# Patient Record
Sex: Male | Born: 1970 | Race: Black or African American | Hispanic: No | Marital: Married | Smoking: Never smoker
Health system: Southern US, Community
[De-identification: ages and names within clinical notes are randomized; demographics above are authoritative.]

## PROBLEM LIST (undated history)

## (undated) DIAGNOSIS — J40 Bronchitis, not specified as acute or chronic: Secondary | ICD-10-CM

## (undated) DIAGNOSIS — I10 Essential (primary) hypertension: Secondary | ICD-10-CM

## (undated) DIAGNOSIS — K219 Gastro-esophageal reflux disease without esophagitis: Secondary | ICD-10-CM

## (undated) HISTORY — PX: HERNIA REPAIR: SHX51

## (undated) HISTORY — PX: BACK SURGERY: SHX140

---

## 1997-05-28 ENCOUNTER — Ambulatory Visit (HOSPITAL_COMMUNITY): Admission: RE | Admit: 1997-05-28 | Discharge: 1997-05-28 | Payer: Self-pay | Admitting: Gastroenterology

## 1997-06-05 ENCOUNTER — Emergency Department (HOSPITAL_COMMUNITY): Admission: EM | Admit: 1997-06-05 | Discharge: 1997-06-05 | Payer: Self-pay | Admitting: Emergency Medicine

## 1998-03-01 ENCOUNTER — Emergency Department (HOSPITAL_COMMUNITY): Admission: EM | Admit: 1998-03-01 | Discharge: 1998-03-01 | Payer: Self-pay | Admitting: Emergency Medicine

## 1998-04-16 ENCOUNTER — Emergency Department (HOSPITAL_COMMUNITY): Admission: EM | Admit: 1998-04-16 | Discharge: 1998-04-16 | Payer: Self-pay | Admitting: Emergency Medicine

## 1998-08-15 ENCOUNTER — Emergency Department (HOSPITAL_COMMUNITY): Admission: EM | Admit: 1998-08-15 | Discharge: 1998-08-15 | Payer: Self-pay | Admitting: Emergency Medicine

## 1999-07-01 ENCOUNTER — Emergency Department (HOSPITAL_COMMUNITY): Admission: EM | Admit: 1999-07-01 | Discharge: 1999-07-01 | Payer: Self-pay | Admitting: Emergency Medicine

## 2000-04-13 ENCOUNTER — Emergency Department (HOSPITAL_COMMUNITY): Admission: EM | Admit: 2000-04-13 | Discharge: 2000-04-13 | Payer: Self-pay | Admitting: Emergency Medicine

## 2000-09-21 ENCOUNTER — Emergency Department (HOSPITAL_COMMUNITY): Admission: EM | Admit: 2000-09-21 | Discharge: 2000-09-21 | Payer: Self-pay | Admitting: Emergency Medicine

## 2000-09-21 ENCOUNTER — Encounter: Payer: Self-pay | Admitting: Emergency Medicine

## 2001-05-30 ENCOUNTER — Encounter: Payer: Self-pay | Admitting: Emergency Medicine

## 2001-05-30 ENCOUNTER — Emergency Department (HOSPITAL_COMMUNITY): Admission: EM | Admit: 2001-05-30 | Discharge: 2001-05-30 | Payer: Self-pay | Admitting: Emergency Medicine

## 2002-03-12 ENCOUNTER — Emergency Department (HOSPITAL_COMMUNITY): Admission: EM | Admit: 2002-03-12 | Discharge: 2002-03-12 | Payer: Self-pay | Admitting: Emergency Medicine

## 2002-03-12 ENCOUNTER — Encounter: Payer: Self-pay | Admitting: Emergency Medicine

## 2002-04-10 ENCOUNTER — Encounter: Admission: RE | Admit: 2002-04-10 | Discharge: 2002-04-10 | Payer: Self-pay | Admitting: Internal Medicine

## 2002-04-10 ENCOUNTER — Encounter: Payer: Self-pay | Admitting: Internal Medicine

## 2002-04-17 ENCOUNTER — Emergency Department (HOSPITAL_COMMUNITY): Admission: EM | Admit: 2002-04-17 | Discharge: 2002-04-18 | Payer: Self-pay | Admitting: Emergency Medicine

## 2002-06-05 ENCOUNTER — Ambulatory Visit (HOSPITAL_COMMUNITY): Admission: RE | Admit: 2002-06-05 | Discharge: 2002-06-07 | Payer: Self-pay | Admitting: Neurosurgery

## 2002-12-15 ENCOUNTER — Emergency Department (HOSPITAL_COMMUNITY): Admission: EM | Admit: 2002-12-15 | Discharge: 2002-12-15 | Payer: Self-pay | Admitting: Emergency Medicine

## 2004-01-21 ENCOUNTER — Emergency Department (HOSPITAL_COMMUNITY): Admission: EM | Admit: 2004-01-21 | Discharge: 2004-01-21 | Payer: Self-pay

## 2004-03-12 ENCOUNTER — Emergency Department (HOSPITAL_COMMUNITY): Admission: EM | Admit: 2004-03-12 | Discharge: 2004-03-12 | Payer: Self-pay | Admitting: Emergency Medicine

## 2004-06-04 ENCOUNTER — Emergency Department (HOSPITAL_COMMUNITY): Admission: EM | Admit: 2004-06-04 | Discharge: 2004-06-04 | Payer: Self-pay | Admitting: Emergency Medicine

## 2004-06-07 ENCOUNTER — Emergency Department (HOSPITAL_COMMUNITY): Admission: EM | Admit: 2004-06-07 | Discharge: 2004-06-07 | Payer: Self-pay | Admitting: Emergency Medicine

## 2004-06-14 ENCOUNTER — Encounter: Admission: RE | Admit: 2004-06-14 | Discharge: 2004-06-14 | Payer: Self-pay | Admitting: Neurosurgery

## 2004-07-28 ENCOUNTER — Inpatient Hospital Stay (HOSPITAL_COMMUNITY): Admission: RE | Admit: 2004-07-28 | Discharge: 2004-07-31 | Payer: Self-pay | Admitting: Neurosurgery

## 2004-10-06 ENCOUNTER — Encounter: Admission: RE | Admit: 2004-10-06 | Discharge: 2004-11-22 | Payer: Self-pay | Admitting: Neurosurgery

## 2005-01-06 ENCOUNTER — Emergency Department (HOSPITAL_COMMUNITY): Admission: EM | Admit: 2005-01-06 | Discharge: 2005-01-06 | Payer: Self-pay | Admitting: Emergency Medicine

## 2005-03-09 ENCOUNTER — Emergency Department (HOSPITAL_COMMUNITY): Admission: EM | Admit: 2005-03-09 | Discharge: 2005-03-09 | Payer: Self-pay | Admitting: *Deleted

## 2005-04-12 ENCOUNTER — Encounter: Admission: RE | Admit: 2005-04-12 | Discharge: 2005-04-12 | Payer: Self-pay | Admitting: Neurosurgery

## 2005-05-13 ENCOUNTER — Encounter: Admission: RE | Admit: 2005-05-13 | Discharge: 2005-06-02 | Payer: Self-pay | Admitting: Neurosurgery

## 2005-07-29 ENCOUNTER — Ambulatory Visit (HOSPITAL_COMMUNITY): Admission: RE | Admit: 2005-07-29 | Discharge: 2005-07-29 | Payer: Self-pay | Admitting: Neurosurgery

## 2005-10-12 ENCOUNTER — Emergency Department (HOSPITAL_COMMUNITY): Admission: EM | Admit: 2005-10-12 | Discharge: 2005-10-12 | Payer: Self-pay | Admitting: *Deleted

## 2005-11-01 ENCOUNTER — Emergency Department (HOSPITAL_COMMUNITY): Admission: EM | Admit: 2005-11-01 | Discharge: 2005-11-01 | Payer: Self-pay | Admitting: Emergency Medicine

## 2006-11-30 ENCOUNTER — Emergency Department (HOSPITAL_COMMUNITY): Admission: EM | Admit: 2006-11-30 | Discharge: 2006-11-30 | Payer: Self-pay | Admitting: Emergency Medicine

## 2007-06-15 ENCOUNTER — Emergency Department (HOSPITAL_COMMUNITY): Admission: EM | Admit: 2007-06-15 | Discharge: 2007-06-16 | Payer: Self-pay | Admitting: Emergency Medicine

## 2008-03-13 ENCOUNTER — Emergency Department (HOSPITAL_COMMUNITY): Admission: EM | Admit: 2008-03-13 | Discharge: 2008-03-13 | Payer: Self-pay | Admitting: Emergency Medicine

## 2008-07-05 ENCOUNTER — Emergency Department (HOSPITAL_COMMUNITY): Admission: EM | Admit: 2008-07-05 | Discharge: 2008-07-06 | Payer: Self-pay | Admitting: *Deleted

## 2010-05-03 LAB — URINALYSIS, ROUTINE W REFLEX MICROSCOPIC
Bilirubin Urine: NEGATIVE
Glucose, UA: NEGATIVE mg/dL
Hgb urine dipstick: NEGATIVE
Protein, ur: NEGATIVE mg/dL
Specific Gravity, Urine: 1.028 (ref 1.005–1.030)

## 2010-05-03 LAB — GC/CHLAMYDIA PROBE AMP, GENITAL
Chlamydia, DNA Probe: NEGATIVE
GC Probe Amp, Genital: NEGATIVE

## 2010-05-11 LAB — DIFFERENTIAL
Lymphs Abs: 1.5 10*3/uL (ref 0.7–4.0)
Monocytes Absolute: 0.4 10*3/uL (ref 0.1–1.0)
Monocytes Relative: 10 % (ref 3–12)
Neutro Abs: 2.5 10*3/uL (ref 1.7–7.7)
Neutrophils Relative %: 55 % (ref 43–77)

## 2010-05-11 LAB — POCT CARDIAC MARKERS
CKMB, poc: 1.2 ng/mL (ref 1.0–8.0)
Myoglobin, poc: 49.3 ng/mL (ref 12–200)
Troponin i, poc: <0.05 ng/mL (ref 0.00–0.09)

## 2010-05-11 LAB — CBC
Hemoglobin: 13.8 g/dL (ref 13.0–17.0)
RBC: 4.29 MIL/uL (ref 4.22–5.81)
WBC: 4.6 10*3/uL (ref 4.0–10.5)

## 2010-05-11 LAB — POCT I-STAT, CHEM 8
HCT: 42 % (ref 39.0–52.0)
Hemoglobin: 14.3 g/dL (ref 13.0–17.0)
Potassium: 3.7 mEq/L (ref 3.5–5.1)
Sodium: 141 mEq/L (ref 135–145)

## 2010-06-11 NOTE — Discharge Summary (Signed)
[name], Andre Walters                 ACCOUNT NO.:  [id number]   MEDICAL RECORD NO.:  [id number]          PATIENT TYPE:  INP   LOCATION:  3011                         FACILITY:  [facility]   PHYSICIAN:  Cristi Loron, M.D.DATE OF BIRTH:  [birth year]   DATE OF ADMISSION:  07/28/2004  DATE OF DISCHARGE:  07/31/2004                                 DISCHARGE SUMMARY   BRIEF HISTORY:  The patient is a 40 year old black male whom I performed a  bilateral L4-5 microdiskectomy on about 2 years ago.  He had good relief of  his radicular pain, but more recently has persistently worsening back pain  which has failed medical management.  I discussed the risk and treatment  options with him and worked him up with a lumbar MRI which demonstrated that  he had significant degenerative disk disease at L4-5.  I discussed the  various treatment options with the patient including surgery.  The patient  has weighed the risks, benefits and alternatives of surgery and decided to  proceed with an L4-5 fusion.   For further details of this admission please refer to the typed history and  physical.   HOSPITAL COURSE:  I admitted the patient to [facility] on  07/28/2004.  On the day of admission I performed an L4-5 fusion.  The  surgery went well.  For full details of this operation please refer to the  typed operative report.   POSTOPERATIVE COURSE:  The patient's postoperative course was unremarkable.  We had PT/OT see him.  By 07/31/2004 on postoperative day #3 the patient was  afebrile and vital signs were stable. He was eating well, ambulating well  and his wound was healing well without signs of infection.  He is requesting  discharge to home. He was, therefore, discharged home on July 31, 2004.   DISCHARGE PRESCRIPTIONS:  1.  Percocet 10/325 #100 one p.o. q.4 h. p.r.n. for pain.  2.  Valium 5 mg #50 one p.o. q.6 h. p.r.n. for muscle spasms.  There are no      refills.   DISCHARGE INSTRUCTIONS:   The patient was given written discharge  instructions and instructed to followup with me in 4 weeks.   FINAL DIAGNOSES:  1.  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  2.  Lumbago.  3.  Lumbar radiculopathy.   PROCEDURE:  1.  Redo L4 laminectomy.  2.  Left 4-5 posterior lumbar interbody fusion.  3.  Insertion of bilateral L4-5 interbody processes (CAPSTONE PK).  4.  Posterior nonsegmental instrumentation with Legacy titanium screws and      rods.  5.  Posterolateral arthrodesis at L4-5 with local morselized autograft bone      MB times bone graft extender.      Cristi Loron, M.D.  Electronically Signed     JDJ/MEDQ  D:  10/14/2004  T:  10/15/2004  Job:  [id number]

## 2010-06-11 NOTE — Op Note (Signed)
NAMEMarland Kitchen  Andre Walters, Andre Walters                             ACCOUNT NO.:  [id number]   MEDICAL RECORD NO.:  [id number]                   PATIENT TYPE:  OIB   LOCATION:  3014                                 FACILITY:  [facility]   PHYSICIAN:  Cristi Loron, M.D.            DATE OF BIRTH:  Nov 09, 1970   DATE OF PROCEDURE:  06/05/2002  DATE OF DISCHARGE:  06/07/2002                                 OPERATIVE REPORT   PREOPERATIVE DIAGNOSES:  L4-5 herniated nucleus pulposus, spinal stenosis,  degenerative disk disease, lumbago, lumbar radiculopathy.   POSTOPERATIVE DIAGNOSES:  L4-5 herniated nucleus pulposus, spinal stenosis,  degenerative disk disease, lumbago, lumbar radiculopathy.   PROCEDURE:  Bilateral L4-5 microdiskectomies using microdissection.   SURGEON:  Cristi Loron, M.D.   ASSISTANT:  Hilda Lias, M.D.   ANESTHESIA:  General endotracheal.   ESTIMATED BLOOD LOSS:  Minimal.   SPECIMENS:  None.   DRAINS:  None.   COMPLICATIONS:  None.   BRIEF HISTORY:  The patient is a 40 year old black male who injured his back  and suffered from persistent back and leg pain.  He failed medical  management and was worked up with a lumbar MRI, which demonstrated a  herniated disk bilaterally at L4-5.  I discussed the various treatment  options with him.  The patient has decided to __________ due to failed  medical management, and weighed the risks, benefits, and alternatives of  surgery.   DESCRIPTION OF PROCEDURE:  The patient was brought to the operating room by  the anesthesia team.  General endotracheal anesthesia was induced.  The  patient was then turned to the prone position on the Wilson frame.  His  lumbosacral region was then prepared with Betadine scrub and Betadine  solution.  Sterile drapes were applied.  I then injected the area to be  incised with Marcaine with epinephrine solution and used a scalpel to make a  linear midline incision over the L4-5 interspace.  I then  used  electrocautery to dissect it down to the thoracolumbar fascia and divide the  fascia bilaterally, performing a bilateral subperiosteal dissection and  stripped the paraspinous musculature from the bilateral spinous process and  lamina of L4 and L5.  I inserted the McCullough retractor for exposure.  I  then obtained the intraoperative radiograph to confirm our location.   We then brought the operating microscope into the field and under its  magnification and illumination completed the microdissection/decompression.  We used the high-speed drill to perform bilateral L4 laminotomies.  We then  widened the laminotomies with the Kerrison punch, removing the bilateral L4-  5 ligamentum flavum.  We then now performed a foraminotomy about the  bilateral L5 nerve root.  We then used microdissection to free up the thecal  sac and nerve root from the epidural tissue, and then Dr. Jeral Fruit carefully  retracted the nerve root and thecal sac medially with the  D'Errico  retractor.  I then inspected the L4-5 intervertebral disk.  I demonstrated a  moderately large central herniation.  I then incised the disk herniation and  then performed a partial diskectomy using the pituitary forceps and the  Epstein and Karlin curettes.  We did this bilaterally.   We then inspected the thecal sac and palpated along the ventral surface of  the thecal sac and along the exit route of the bilateral L5 nerve roots and  noted that the neural structures were well-decompressed.  We attained  stringent hemostasis using bipolar electrocautery and copiously irrigated  the wound out with bacitracin solution.  We then removed the solution and  then removed the [facility] retractor and then reapproximated the patient's  thoracolumbar fascia with interrupted #1 Vicryl suture, the subcutaneous  tissue using interrupted 0 Vicryl suture, and the skin with Steri-Strips and  Benzoin.  The wound was then coated with bacitracin  ointment, a sterile  dressing was applied, the drapes were removed.  The patient was subsequently  returned to the supine position, where he was extubated by the anesthesia  team and transported to the postanesthesia care unit in stable condition.  All sponge, instrument, and needle counts were correct at the end of this  case.                                               Cristi Loron, M.D.    JDJ/MEDQ  D:  06/06/2002  T:  06/07/2002  Job:  [id number]

## 2010-06-11 NOTE — Op Note (Signed)
NAMECHALES, PELISSIER                 ACCOUNT NO.:  [id number]   MEDICAL RECORD NO.:  [id number]          PATIENT TYPE:  INP   LOCATION:  3011                         FACILITY:  [facility]   PHYSICIAN:  Cristi Loron, M.D.DATE OF BIRTH:  06/23/1970   DATE OF PROCEDURE:  07/28/2004  DATE OF DISCHARGE:                                 OPERATIVE REPORT   BRIEF. HISTORY:  The patient is a 40 year old black male who I performed a  bilateral L4-5 microdiskectomy on about 2 years ago.  he had good relief of  his radicular leg pain; however, more recently he has had persistently  worsening back pain which has failed medical management.  I discussed the  various treatment options with him and worked him up with a lumbar MRI that  demonstrated he had significant degenerative disk disease at L4-5.  I  discussed the risks and treatment options including surgery.  The patient  has weighed the risks, benefits and alternatives to surgery and decided to  proceed with an L4-5 fusion.   PREOPERATIVE DIAGNOSIS:  L4-5 degenerative disk disease, lumbago, lumbar  radiculopathy.   POSTOPERATIVE DIAGNOSIS:  L4-5 degenerative disk disease, lumbago, lumbar  radiculopathy.   PROCEDURE:  Redo L4 laminectomy; L4-5 posterior lumbar interbody fusion;  insertion of bilateral L4-5 interbody prostheses (CAPSTONE PEEK cages);  posterior nonsegmental instrumentation with Legacy titanium pedicle screws  and rods; posterolateral arthrodesis at L4-5 with local morcellized  autograft bone and VITOSS bone graft extender.   SURGEON:  Cristi Loron, M.D.   ASSISTANT:  Clydene Fake, M.D.   ANESTHESIA:  General endotracheal.   ESTIMATED BLOOD LOSS:  250 mL.   SPECIMENS:  None.   DRAINS:  None.   COMPLICATIONS:  None.   DESCRIPTION OF PROCEDURE:  The patient was brought to the operating room per  anesthesia team.  General endotracheal anesthesia was induced.  The patient  was carefully turned to the prone  position on the Wilson frame.  His  lumbosacral region was then prepared with Betadine scrub with Betadine  solution.  Sterile drapes were applied.  I then injected the area to be  incised with Marcaine with epinephrine solution.  I used a scalpel to make a  linear midline incision through the patient's previous surgical incision,  extending it in both a cephalad and caudal direction.  I used electrocautery  to perform a subperiosteal dissection, exposing the spinous process and  lamina of L3, 4 and 5.  I then obtained intraoperative radiograph to confirm  our location.  I then placed the VersaTrac retractor for exposure.  We began  by clearing the soft tissue from the L4 spinous process and lamina using a  high-speed drill and the Leksell rongeurs.  We encountered the expected  amount of epidural fibrosis from the previous surgery.  I then used a high-  speed drill to extend the patient's previous L4 laminotomy in a cephalad  direction until I encountered some relatively non-scarred dura.  I then  carefully dissected the epidural fibrosis from the dura and then used a  Kerrison punch to widen  the laminotomy.  I used a scalpel to incise the  interspinous ligament at L3-4 and L4-5.  I then used the Leksell rongeur to  remove the spinous process of L4; we used this and the bone from the  laminectomy as the local autograft, i.e., after we cleared it of soft  tissue.  I completed the L4 redo laminectomy using a Kerrison punch and  performed a generous foraminotomy about the bilateral L4 and L5 nerve roots.   We now turned out attention to the diskectomy.  We carefully freed up the  thecal sac from the epidural fibrosis and then gently retracted it and the  L5 nerve root medially, exposed the underlying L4-5 intervertebral disk and  incised the intervertebral disk with a 15 blade scalpel and performed an  aggressive diskectomy using pituitary forceps and the Epstein and scovil  curettes; we did  this bilaterally.   We now turned our attention to the posterior lumbar interbody fusion.  We  cleared off soft tissue from the intervertebral endplates at L4-5.  We then  prefilled a 12 x 26-mm CAPSTONE PEEK cage with local morcelized autograft  VITOSS bone graft extender.  We then carefully retracted the L4 and L5 nerve  roots and the thecal sac out of harm's way and inserted this PEEK cage at  the L4-5 interspace.  We then filled in medially in the disk space with  VITOSS and autograft bone and then placed a second CAPSTONE PEEK cage on the  contralateral side, completing the posterolateral arthrodesis.   We now turned our attention to the posterior nonsegmental instrumentation.  Under fluoroscopic guidance, we cannulated the bilateral L4 and L5 pedicles  with bone probes.  We tapped throughout the pedicles with a 5.5-mm tap.  We  probed inside the tapped pedicles, then burred out cortical breeches.  We  then inserted 6.5 x 45-mm pedicle screws bilaterally at L4 and L5.  We then  palpated along the medial aspect of the bilateral L4 and L5 pedicles and  noted there were no cortical breeches and that the L4 and L5 nerve roots  were not injured in any way.  We then connected the unilateral pedicle  screws with a lordotic rod and fastened the rod in place with the caps,  which we torqued appropriately, completing the instrumentation.   We now turned our attention to the posterolateral arthrodesis.  we used the  drill to decorticate the remainder of the L4-5 facet, the L4 pars and  transverses processes bilaterally at L4-5.  We then laid a combination of  localized morcelized bone and VITOSS bone graft extender onto to these  decorticated posterolateral structures, completing the posterolateral  arthrodesis.  We then obtained stringent hemostasis using bipolar  electrocautery.  We inspected the L4 and L5 nerve roots and noted that they and the thecal sac were well-decompressed.  We then  the VersaTrac retractor  and then reapproximated the patient's thoracolumbar fascia with interrupted  #1 Vicryl suture, subcutaneous tissue with interrupted 2-0 Vicryl suture and  the skin with Steri-Strips and Benzoin.  The wound was then coated with  Bacitracin ointment.  Sterile dressing was applied, the drapes were removed  and the patient was subsequently returned to the supine position, where he  was extubated by the anesthesia team and transported to the postanesthesia  care unit in stable condition.  All sponge, instrument and needle counts  were correct at the end of the case.       JDJ/MEDQ  D:  07/29/2004  T:  07/29/2004  Job:  [id number]

## 2010-10-22 ENCOUNTER — Emergency Department (HOSPITAL_COMMUNITY): Payer: Self-pay

## 2010-10-22 ENCOUNTER — Emergency Department (HOSPITAL_COMMUNITY)
Admission: EM | Admit: 2010-10-22 | Discharge: 2010-10-22 | Disposition: A | Discharge status: Home / Self Care | Payer: Self-pay | Attending: Emergency Medicine | Admitting: Emergency Medicine

## 2010-10-22 DIAGNOSIS — J40 Bronchitis, not specified as acute or chronic: Secondary | ICD-10-CM | POA: Insufficient documentation

## 2010-10-22 DIAGNOSIS — R079 Chest pain, unspecified: Secondary | ICD-10-CM | POA: Insufficient documentation

## 2010-10-22 DIAGNOSIS — R05 Cough: Secondary | ICD-10-CM | POA: Insufficient documentation

## 2010-10-22 DIAGNOSIS — R059 Cough, unspecified: Secondary | ICD-10-CM | POA: Insufficient documentation

## 2010-11-02 LAB — I-STAT 8, (EC8 V) (CONVERTED LAB)
BUN: 14
Bicarbonate: 25.6 — ABNORMAL HIGH
Chloride: 106
Glucose, Bld: 97
HCT: 44
Hemoglobin: 15
Operator id: 288331
Potassium: 4.1
Sodium: 138
TCO2: 27
pCO2, Ven: 42.7 — ABNORMAL LOW
pH, Ven: 7.387 — ABNORMAL HIGH

## 2010-11-02 LAB — POCT I-STAT CREATININE
Creatinine, Ser: 1.1
Operator id: 288331

## 2011-08-09 ENCOUNTER — Emergency Department (HOSPITAL_COMMUNITY)
Admission: EM | Admit: 2011-08-09 | Discharge: 2011-08-09 | Disposition: A | Discharge status: Home / Self Care | Payer: BC Managed Care – PPO | Attending: Emergency Medicine | Admitting: Emergency Medicine

## 2011-08-09 ENCOUNTER — Encounter (HOSPITAL_COMMUNITY): Payer: Self-pay

## 2011-08-09 ENCOUNTER — Emergency Department (HOSPITAL_COMMUNITY): Payer: BC Managed Care – PPO

## 2011-08-09 DIAGNOSIS — F0781 Postconcussional syndrome: Secondary | ICD-10-CM | POA: Insufficient documentation

## 2011-08-09 DIAGNOSIS — R51 Headache: Secondary | ICD-10-CM | POA: Insufficient documentation

## 2011-08-09 DIAGNOSIS — W1809XA Striking against other object with subsequent fall, initial encounter: Secondary | ICD-10-CM | POA: Insufficient documentation

## 2011-08-09 NOTE — ED Provider Notes (Signed)
Medical screening examination/treatment/procedure(s) were performed by non-physician practitioner and as supervising physician I was immediately available for consultation/collaboration.   Glynn Octave, MD 08/09/11 [phone]

## 2011-08-09 NOTE — ED Notes (Signed)
Playing with kids yesterday and ran out of bathroom and into wall, sts then fell down, pt sts foggy episode after that for a few minutes. But no total loss of consciousness

## 2011-08-09 NOTE — ED Provider Notes (Signed)
History     CSN: [id number]  Arrival date & time 08/09/11  1200   First MD Initiated Contact with Patient 08/09/11 1224      Chief Complaint  Patient presents with  . Fall    (Consider location/radiation/quality/duration/timing/severity/associated sxs/prior treatment) HPI Comments: Patient presents emergency department with a chief complaint of head trauma.  Patient states she was taking his kids very fast the other day running as fast he couldn't when he went head first into a concrete wall.  Patient blacked out but is unsure how long he was out for.  Patient has been having foggy thoughts ever sense associated with headaches, nausea, and blurred vision.  Headache severity Is rated at 4/10 and described as constant.  Patient denies any numbness or tingling of extremities, weakness, fainting, seizures, lightheadedness, ataxia, tinnitus, facial droop or disequilibrium.  No other complaints this time.  Patient is a 41 y.o. male presenting with fall. The history is provided by the patient.  Fall    History reviewed. No pertinent past medical history.  Past Surgical History  Procedure Date  . Hernia repair     History reviewed. No pertinent family history.  History  Substance Use Topics  . Smoking status: Never Smoker   . Smokeless tobacco: Not on file  . Alcohol Use: Yes      Review of Systems  All other systems reviewed and are negative.    Allergies  Hydrocodone  Home Medications   Current Outpatient Rx  Name Route Sig Dispense Refill  . VITAMIN C 1000 MG PO TABS Oral Take 1,000 mg by mouth daily.    . IBUPROFEN 200 MG PO TABS Oral Take 400 mg by mouth every 6 (six) hours as needed. For pain      BP 138/98  Pulse 77  Temp 98.3 F (36.8 C) (Oral)  Resp 18  SpO2 97%  Physical Exam  Nursing note and vitals reviewed. Constitutional: He is oriented to person, place, and time. He appears well-developed and well-nourished. No distress.  HENT:  Head:  Normocephalic and atraumatic.  Eyes: Conjunctivae and EOM are normal. Pupils are equal, round, and reactive to light. No scleral icterus.  Neck: Normal range of motion and full passive range of motion without pain. Neck supple. No JVD present. Carotid bruit is not present. No rigidity. No Brudzinski's sign noted.  Cardiovascular: Normal rate, regular rhythm, normal heart sounds and intact distal pulses.   Pulmonary/Chest: Effort normal and breath sounds normal. No respiratory distress. He has no wheezes. He has no rales.  Musculoskeletal: Normal range of motion.  Lymphadenopathy:    He has no cervical adenopathy.  Neurological: He is alert and oriented to person, place, and time. He has normal strength. No cranial nerve deficit or sensory deficit. He displays a negative Romberg sign. Coordination and gait normal. GCS eye subscore is 4. GCS verbal subscore is 5. GCS motor subscore is 6.       A&O x3.  Able to follow commands. PERRL, EOMs, no vertical or bidirectional nystagmus. Shoulder shrug, facial muscles, tongue protrusion and swallow intact.  Motor strength 5/5 bilaterally including grip strength, triceps, hamstrings and ankle dorsiflexion.  Normal patellar DTRs.  Light touch intact in all 4 distal limbs.  Intact finger to nose, shin to heel and rapid alternating movements. No ataxia or dysequilibrium.   Skin: Skin is warm and dry. No rash noted. He is not diaphoretic.  Psychiatric: He has a normal mood and affect. His behavior is normal.  ED Course  Procedures (including critical care time)  Labs Reviewed - No data to display Ct Head Wo Contrast  08/09/2011  *RADIOLOGY REPORT*  Clinical Data: Blow to the head with loss of consciousness.  CT HEAD WITHOUT CONTRAST  Technique:  Contiguous axial images were obtained from the base of the skull through the vertex without contrast.  Comparison: Head CT scan 11/30/2006.  Findings: There is no evidence of acute intracranial abnormality including  infarction, hemorrhage, mass lesion, mass effect, midline shift or abnormal extra-axial fluid collection.  No hydrocephalus or pneumocephalus.  Cava septum pellucidum is noted.  There is some mucosal thickening in the right ethmoid air cells.  IMPRESSION:  1.  No acute finding. 2.  Mucosal thickening right ethmoid air cells.  Original Report Authenticated By: Bernadene Bell. Maricela Curet, M.D.     No diagnosis found.    MDM  Post concussive syndrome  Labs and imaging reviewed.  At this time there does not appear to be any evidence of an acute emergency medical condition and the patient appears stable for discharge with appropriate outpatient follow up.Diagnosis was discussed with patient who verbalizes understanding and is agreeable to discharge.         Jaci Carrel, [place] 08/09/11 1356

## 2012-06-12 ENCOUNTER — Encounter (HOSPITAL_COMMUNITY): Payer: Self-pay | Admitting: Emergency Medicine

## 2012-06-12 ENCOUNTER — Emergency Department (HOSPITAL_COMMUNITY)
Admission: EM | Admit: 2012-06-12 | Discharge: 2012-06-12 | Disposition: A | Discharge status: Home / Self Care | Payer: BC Managed Care – PPO | Attending: Emergency Medicine | Admitting: Emergency Medicine

## 2012-06-12 DIAGNOSIS — R6889 Other general symptoms and signs: Secondary | ICD-10-CM | POA: Insufficient documentation

## 2012-06-12 DIAGNOSIS — J3489 Other specified disorders of nose and nasal sinuses: Secondary | ICD-10-CM | POA: Insufficient documentation

## 2012-06-12 DIAGNOSIS — R51 Headache: Secondary | ICD-10-CM | POA: Insufficient documentation

## 2012-06-12 MED ORDER — CETIRIZINE-PSEUDOEPHEDRINE ER 5-120 MG PO TB12: 1.0000 | ORAL_TABLET | Freq: Every day | ORAL | Status: DC

## 2012-06-12 MED ORDER — METHOCARBAMOL 500 MG PO TABS: 500.0000 mg | ORAL_TABLET | Freq: Two times a day (BID) | ORAL | Status: DC

## 2012-06-12 NOTE — ED Provider Notes (Signed)
History    This chart was scribed for non-physician practitioner, Fayrene Helper PA-C working with Celene Kras, MD by Donne Anon, ED Scribe. This patient was seen in room WTR6/WTR6 and the patient's care was started at 1735.   CSN: [id number]  Arrival date & time 06/12/12  1638   First MD Initiated Contact with Patient 06/12/12 1735      Chief Complaint  Patient presents with  . Headache  . Nasal Congestion     The history is provided by the patient. No language interpreter was used.   HPI Comments: Andre Walters is a 42 y.o. male who presents to the Emergency Department complaining of several months of gradual onset, gradually worsening, intermittent HA described as pressure behind his eye, which occasionally combines with neck tightness, and is usually present when he wakes up in the morning and is resolved within a few hours with ibuprofen. He reports associated nasal congestion and rhinorrhea. He denies double vision, fever, chills, numbness or weakness in his extremities or any other pain. He does not wear contacts or prescription glasses, although he states he does sometimes strain his eyes to see.   History reviewed. No pertinent past medical history.  Past Surgical History  Procedure Laterality Date  . Hernia repair      No family history on file.  History  Substance Use Topics  . Smoking status: Never Smoker   . Smokeless tobacco: Not on file  . Alcohol Use: Yes      Review of Systems  Constitutional: Negative for fever and chills.  HENT: Positive for congestion and rhinorrhea.   Neurological: Positive for headaches. Negative for weakness and numbness.  All other systems reviewed and are negative.    Allergies  Hydrocodone  Home Medications   Current Outpatient Rx  Name  Route  Sig  Dispense  Refill  . Ascorbic Acid (VITAMIN C) 1000 MG tablet   Oral   Take 1,000 mg by mouth daily.         Marland Kitchen ibuprofen (ADVIL,MOTRIN) 200 MG tablet   Oral   Take 400 mg  by mouth every 6 (six) hours as needed. For pain         . Phenylephrine-Acetaminophen (VICKS DAYQUIL SINUS) 5-325 MG CAPS   Oral   Take 2 capsules by mouth once.         . sodium chloride (OCEAN) 0.65 % nasal spray   Nasal   Place 1 spray into the nose as needed for congestion.           BP 132/63  Pulse 92  Temp(Src) 98.4 F (36.9 C) (Oral)  Resp 18  SpO2 100%  Physical Exam  Nursing note and vitals reviewed. Constitutional: He is oriented to person, place, and time. He appears well-developed and well-nourished. No distress.  HENT:  Head: Normocephalic and atraumatic.  Mouth/Throat: Oropharynx is clear and moist. No oropharyngeal exudate.  Eyes: Conjunctivae and EOM are normal. Pupils are equal, round, and reactive to light.  Neck: Normal range of motion. Neck supple. No tracheal deviation present.  No nuchal rigidity, or meningismal sign  Cardiovascular: Normal rate.   Pulmonary/Chest: Effort normal and breath sounds normal. No respiratory distress. He has no wheezes.  Musculoskeletal: Normal range of motion.  Neurological: He is alert and oriented to person, place, and time. No cranial nerve deficit.  Skin: Skin is warm and dry.  Psychiatric: He has a normal mood and affect. His behavior is normal.    ED  Course  Procedures (including critical care time) DIAGNOSTIC STUDIES: Oxygen Saturation is 100% on room air, normal by my interpretation.    COORDINATION OF CARE: 5:41 PM Discussed treatment plan which includes visual acuity exam with pt at bedside and pt agreed to plan.   17:46:40 Visual Acuity CG Visual Acuity - R Distance: 20/20 ; L Distance: 20/20  6:29 PM Normal visual acuity.  No acute onset headache concerning for SAH, no focal neuro deficits concerning for stroke, no fever or neck stiffness concerning for meningitis.  Likely tension headache.  Will prescribe muscle relaxant, and give resources for PCP f/u.    Labs Reviewed - No data to display No  results found.   1. Headache around the eyes       MDM  BP 132/63  Pulse 92  Temp(Src) 98.4 F (36.9 C) (Oral)  Resp 18  SpO2 100%    I personally performed the services described in this documentation, which was scribed in my presence. The recorded information has been reviewed and is accurate.          Fayrene Helper, PA-C 06/12/12 [phone]

## 2012-06-12 NOTE — ED Notes (Signed)
Pt ambulatory to exam room with steady gait. Pt states he has sinus pain and pressure. States he has had a headache for the past 3 weeks. Pt also states he has some slight nausea. Pt states he drove himself here.

## 2012-06-12 NOTE — ED Notes (Signed)
Pt c/o headache & sinus congestion x 3 wks.  States he has tried all kinds of OTC medications with no relief.

## 2012-06-13 NOTE — ED Provider Notes (Signed)
Medical screening examination/treatment/procedure(s) were performed by non-physician practitioner and as supervising physician I was immediately available for consultation/collaboration.    Kortni Hasten R Ellerie Arenz, MD 06/13/12 0006 

## 2013-04-02 ENCOUNTER — Ambulatory Visit (INDEPENDENT_AMBULATORY_CARE_PROVIDER_SITE_OTHER): Payer: BC Managed Care – PPO | Admitting: Emergency Medicine

## 2013-04-02 VITALS — BP 110/80 | HR 96 | Temp 98.7°F | Resp 16 | Ht 71.5 in | Wt 179.0 lb

## 2013-04-02 DIAGNOSIS — J329 Chronic sinusitis, unspecified: Secondary | ICD-10-CM

## 2013-04-02 MED ORDER — PSEUDOEPHEDRINE HCL 60 MG PO TABS: 60.0000 mg | ORAL_TABLET | ORAL | Status: DC | PRN

## 2013-04-02 NOTE — Patient Instructions (Signed)

## 2013-04-02 NOTE — Progress Notes (Signed)
   Subjective:    Patient ID: Andre Walters, male    DOB: 1970-11-15, 43 y.o.   MRN: [id number]  HPI 1.5 weeks of headache and sinus pressure.  Took alegra, nyquil, and cold and flu medicines without significant improvement.  Pain localized to face below eyes.  No weakness or numbness.  No neck pain.  Patient notes uri symptoms as well.  History of headaches in past, different from this one.  PPMH:  Headaches., negative for hypertension.  SH:  Non smoker, occasional alcohol   Review of Systems  Constitutional: Negative for fever and chills.  HENT: Positive for congestion, postnasal drip, rhinorrhea and sinus pressure. Negative for ear pain, facial swelling, nosebleeds, sore throat and trouble swallowing.   Respiratory: Negative for cough, chest tightness and shortness of breath.   Cardiovascular: Negative for chest pain.  Musculoskeletal: Negative for neck pain and neck stiffness.  Skin: Negative for rash.  Neurological: Positive for headaches. Negative for weakness, light-headedness and numbness.       Objective:   Physical Exam Blood pressure 110/80, pulse 96, temperature 98.7 F (37.1 C), temperature source Oral, resp. rate 16, height 5' 11.5" (1.816 m), weight 179 lb (81.194 kg), SpO2 97.00%. Body mass index is 24.62 kg/(m^2). Well-developed, well nourished male who is awake, alert and oriented, in NAD. HEENT: [place]/AT, PERRL, EOMI.  Sclera and conjunctiva are clear.  EAC are patent, TMs are normal in appearance. Nasal mucosa edematous. OP is clear.  Maxillary sinus TTP bilaterally Neck: supple, non-tender, no lymphadenopathy, thyromegaly. Heart: RRR, no murmur Lungs: normal effort, CTA Abdomen: normo-active bowel sounds, supple, non-tender, no mass or organomegaly. Extremities: no cyanosis, clubbing or edema. Skin: warm and dry without rash. Psychologic: good mood and appropriate affect, normal speech and behavior.      Assessment & Plan:  Diagnosis:  Acute  sinusitis.  Prescription for psuedoephedrine.  Patient to follow up if worsening symptoms or no relief in 1.5-2 weeks.

## 2013-04-12 ENCOUNTER — Ambulatory Visit (INDEPENDENT_AMBULATORY_CARE_PROVIDER_SITE_OTHER): Payer: BC Managed Care – PPO | Admitting: Emergency Medicine

## 2013-04-12 VITALS — BP 116/80 | HR 102 | Temp 98.8°F | Resp 16 | Ht 69.0 in | Wt 179.2 lb

## 2013-04-12 DIAGNOSIS — J209 Acute bronchitis, unspecified: Secondary | ICD-10-CM

## 2013-04-12 DIAGNOSIS — J018 Other acute sinusitis: Secondary | ICD-10-CM

## 2013-04-12 MED ORDER — AMOXICILLIN-POT CLAVULANATE 875-125 MG PO TABS: 1.0000 | ORAL_TABLET | Freq: Two times a day (BID) | ORAL | Status: DC

## 2013-04-12 MED ORDER — PSEUDOEPHEDRINE-GUAIFENESIN ER 60-600 MG PO TB12: 1.0000 | ORAL_TABLET | Freq: Two times a day (BID) | ORAL | Status: DC

## 2013-04-12 NOTE — Patient Instructions (Signed)

## 2013-04-12 NOTE — Progress Notes (Signed)
[facility] [address], [place] [address] [phone]  Date:  04/12/2013   Name:  Andre Walters   DOB:  [birth year]   MRN:  [id number]  PCP:  Default, Provider, MD    Chief Complaint: Sore Throat and still congested from last visit   History of Present Illness:  Andre Walters is a 43 y.o. very pleasant male patient who presents with the following:  Seen last week with sinusitis.  Now has increased symptoms.  Has mucopurulent nasal drainage and post nasal drip.  No fever or chills.  Feels bad overall.  Fatigued.  Has a cough with a mucopurulent sputum. No wheezing or shortness of breath.  Has an intense sore throat.  No improvement with over the counter medications or other home remedies. Denies other complaint or health concern today.  There are no active problems to display for this patient.   History reviewed. No pertinent past medical history.  Past Surgical History  Procedure Laterality Date  . Hernia repair      History  Substance Use Topics  . Smoking status: Never Smoker   . Smokeless tobacco: Not on file  . Alcohol Use: Yes    Family History  Problem Relation Age of Onset  . Hypertension Mother     Allergies  Allergen Reactions  . Hydrocodone Other (See Comments)    nightmares    Medication list has been reviewed and updated.  Current Outpatient Prescriptions on File Prior to Visit  Medication Sig Dispense Refill  . Ascorbic Acid (VITAMIN C) 1000 MG tablet Take 1,000 mg by mouth daily.      . pseudoephedrine (SUDAFED) 60 MG tablet Take 1 tablet (60 mg total) by mouth every 4 (four) hours as needed for congestion.  30 tablet  0   No current facility-administered medications on file prior to visit.    Review of Systems:  As per HPI, otherwise negative.    Physical Examination: Filed Vitals:   04/12/13 1756  BP: 116/80  Pulse: 102  Temp: 98.8 F (37.1 C)  Resp: 16   Filed Vitals:   04/12/13 1756  Height: 5\' 9"   (1.753 m)  Weight: 179 lb 3.2 oz (81.285 kg)   Body mass index is 26.45 kg/(m^2). Ideal Body Weight: Weight in (lb) to have BMI = 25: 168.9  GEN: WDWN, NAD, Non-toxic, A & O x 3 HEENT: Atraumatic, Normocephalic. Neck supple. No masses, No LAD. Ears and Nose: No external deformity. CV: RRR, No M/G/R. No JVD. No thrill. No extra heart sounds. PULM: CTA B, no wheezes, crackles, rhonchi. No retractions. No resp. distress. No accessory muscle use. ABD: S, NT, ND, +BS. No rebound. No HSM. EXTR: No c/c/e NEURO Normal gait.  PSYCH: Normally interactive. Conversant. Not depressed or anxious appearing.  Calm demeanor.    Assessment and Plan: Sinusitis  Bronchitis  Signed,  Phillips OdorJeffery Yeslin Delio, MD

## 2013-05-02 ENCOUNTER — Ambulatory Visit (INDEPENDENT_AMBULATORY_CARE_PROVIDER_SITE_OTHER): Payer: BC Managed Care – PPO | Admitting: Physician Assistant

## 2013-05-02 VITALS — BP 124/82 | HR 99 | Temp 98.1°F | Resp 16 | Ht 69.0 in | Wt 182.0 lb

## 2013-05-02 DIAGNOSIS — W57XXXA Bitten or stung by nonvenomous insect and other nonvenomous arthropods, initial encounter: Secondary | ICD-10-CM

## 2013-05-02 DIAGNOSIS — T148 Other injury of unspecified body region: Secondary | ICD-10-CM

## 2013-05-02 DIAGNOSIS — L282 Other prurigo: Secondary | ICD-10-CM

## 2013-05-02 MED ORDER — HYDROXYZINE HCL 10 MG PO TABS: 10.0000 mg | ORAL_TABLET | Freq: Three times a day (TID) | ORAL | Status: DC | PRN

## 2013-05-02 NOTE — Progress Notes (Signed)
   Subjective:    Patient ID: Andre LevinsKevin L Borman, male    DOB: 04-21-1970, 43 y.o.   MRN: [id number]  HPI 43 year old male presents for evaluation of an insect bite on his left shoulder. The incident occurred today while he was at work. States he felt a bite on his shoulder and then saw a bug fall out from under his shirt sleeve.  Since then he has noticed some swelling and has now developed pruritis and a "burning" sensation.  Admits he used some hydrocortisone cream that did seem to help.   Denies lip/tongue swelling, SOB, wheezing, or coughing.     Review of Systems  Constitutional: Negative for fever and chills.  HENT: Negative for trouble swallowing.   Respiratory: Negative for cough, chest tightness and shortness of breath.   Gastrointestinal: Negative for nausea and vomiting.  Neurological: Negative for dizziness.       Objective:   Physical Exam  Constitutional: He is oriented to person, place, and time. He appears well-developed and well-nourished.  HENT:  Head: Normocephalic and atraumatic.  Right Ear: External ear normal.  Left Ear: External ear normal.  Eyes: Conjunctivae are normal.  Neck: Normal range of motion.  Cardiovascular: Normal rate.   Pulmonary/Chest: Effort normal.  Neurological: He is alert and oriented to person, place, and time.  Skin:     Noted area patient reports discomfort and pruritis. No visible rash, swelling, warmth, or insect bite.    Psychiatric: He has a normal mood and affect. His behavior is normal. Judgment and thought content normal.          Assessment & Plan:  Insect bite  Pruritic rash - Plan: hydrOXYzine (ATARAX/VISTARIL) 10 MG tablet  Likely insect bite that is resolving. Continue OTC hydrocortisone to help with itching Atarax qhs prn and Zyrtec daily in the morning.  Monitor for any changes in skin color or development of new sx's. RTC or go to ER with any SOB, lip/tongue swelling.

## 2013-05-02 NOTE — Patient Instructions (Signed)
Take Zyrtec daily in the morning  Use hydrocortisone cream 2-3 times daily Recommend ice for 20 minutes to help with swelling Return to clinic if you notice any skin changes or worsening of symptoms. To ER if acutely worse      Insect Bite Mosquitoes, flies, fleas, bedbugs, and many other insects can bite. Insect bites are different from insect stings. A sting is when venom is injected into the skin. Some insect bites can transmit infectious diseases. SYMPTOMS  Insect bites usually turn red, swell, and itch for 2 to 4 days. They often go away on their own. TREATMENT  Your caregiver may prescribe antibiotic medicines if a bacterial infection develops in the bite. HOME CARE INSTRUCTIONS  Do not scratch the bite area.  Keep the bite area clean and dry. Wash the bite area thoroughly with soap and water.  Put ice or cool compresses on the bite area.  Put ice in a plastic bag.  Place a towel between your skin and the bag.  Leave the ice on for 20 minutes, 4 times a day for the first 2 to 3 days, or as directed.  You may apply a baking soda paste, cortisone cream, or calamine lotion to the bite area as directed by your caregiver. This can help reduce itching and swelling.  Only take over-the-counter or prescription medicines as directed by your caregiver.  If you are given antibiotics, take them as directed. Finish them even if you start to feel better. You may need a tetanus shot if:  You cannot remember when you had your last tetanus shot.  You have never had a tetanus shot.  The injury broke your skin. If you get a tetanus shot, your arm may swell, get red, and feel warm to the touch. This is common and not a problem. If you need a tetanus shot and you choose not to have one, there is a rare chance of getting tetanus. Sickness from tetanus can be serious. SEEK IMMEDIATE MEDICAL CARE IF:   You have increased pain, redness, or swelling in the bite area.  You see a red line on  the skin coming from the bite.  You have a fever.  You have joint pain.  You have a headache or neck pain.  You have unusual weakness.  You have a rash.  You have chest pain or shortness of breath.  You have abdominal pain, nausea, or vomiting.  You feel unusually tired or sleepy. MAKE SURE YOU:   Understand these instructions.  Will watch your condition.  Will get help right away if you are not doing well or get worse. Document Released: 02/18/2004 Document Revised: 04/04/2011 Document Reviewed: 08/11/2010 [facility] Patient Information 2014 [employer] [place].

## 2013-06-07 ENCOUNTER — Emergency Department (HOSPITAL_COMMUNITY)
Admission: EM | Admit: 2013-06-07 | Discharge: 2013-06-07 | Disposition: A | Discharge status: Home / Self Care | Payer: BC Managed Care – PPO | Attending: Emergency Medicine | Admitting: Emergency Medicine

## 2013-06-07 ENCOUNTER — Encounter (HOSPITAL_COMMUNITY): Payer: Self-pay | Admitting: Emergency Medicine

## 2013-06-07 DIAGNOSIS — R110 Nausea: Secondary | ICD-10-CM | POA: Insufficient documentation

## 2013-06-07 DIAGNOSIS — R071 Chest pain on breathing: Secondary | ICD-10-CM | POA: Insufficient documentation

## 2013-06-07 DIAGNOSIS — R42 Dizziness and giddiness: Secondary | ICD-10-CM | POA: Insufficient documentation

## 2013-06-07 DIAGNOSIS — Z79899 Other long term (current) drug therapy: Secondary | ICD-10-CM | POA: Insufficient documentation

## 2013-06-07 DIAGNOSIS — R51 Headache: Secondary | ICD-10-CM | POA: Insufficient documentation

## 2013-06-07 NOTE — ED Provider Notes (Signed)
CSN: [id number]     Arrival date & time 06/07/13  [id number] History   First MD Initiated Contact with Patient 06/07/13 0915     Chief Complaint  Patient presents with  . Chest Pain  . Numbness     (Consider location/radiation/quality/duration/timing/severity/associated sxs/prior Treatment) HPI Complains of dizziness meaning sensation of room spinning onset 2 weeks ago worse with moving his head and with remaining still. Symptoms accompanied by nausea. He also reports mild headache onset this morning he also complains of burning in his left arm, no numbness, or weakness episodes of burning in left upper arm last one or 2 seconds resolve spontaneously. Sensation is not exacerbated or ameliorated by anything. Patient also complains of left anterior chest pain for 2 weeks. Nonexertional lasts for seconds at a time no associated shortness of breath numbness or sweatiness. Did not come simultaneously with arm burning. No other associated symptoms no treatment prior to coming here. Patient is not feeling dizzy presently. He denies visual changes denies difficulty with speaking no other associated symptoms. History reviewed. No pertinent past medical history. Past Surgical History  Procedure Laterality Date  . Hernia repair     past medical history negative Family History  Problem Relation Age of Onset  . Hypertension Mother    at risk factors mother had CABG age 42 otherwise negative History  Substance Use Topics  . Smoking status: Never Smoker   . Smokeless tobacco: Not on file  . Alcohol Use: Yes   2 alcoholic drinks per week no drug use  Review of Systems  Constitutional: Negative.   Respiratory: Negative.   Cardiovascular: Positive for chest pain.  Gastrointestinal: Negative.   Musculoskeletal: Negative.   Skin: Negative.   Neurological: Positive for dizziness and headaches.  Psychiatric/Behavioral: Negative.   All other systems reviewed and are negative.     Allergies   Hydrocodone  Home Medications   Prior to Admission medications   Medication Sig Start Date End Date Taking? Authorizing Provider  cetirizine (ZYRTEC) 10 MG tablet Take 10 mg by mouth daily.   Yes Historical Provider, MD  Naphazoline HCl (CLEAR EYES OP) Apply 1 drop to eye daily as needed (for dry, itchy eyes).   Yes Historical Provider, MD   BP 142/82  Pulse 90  Temp(Src) 98.2 F (36.8 C) (Oral)  Resp 18  Ht 5\' 11"  (1.803 m)  Wt 177 lb (80.287 kg)  BMI 24.70 kg/m2  SpO2 100% Physical Exam  Nursing note and vitals reviewed. Constitutional: He is oriented to person, place, and time. He appears well-developed and well-nourished.  HENT:  Head: Normocephalic and atraumatic.  Eyes: Conjunctivae are normal. Pupils are equal, round, and reactive to light.  Neck: Neck supple. No tracheal deviation present. No thyromegaly present.  Cardiovascular: Normal rate and regular rhythm.   No murmur heard. Pulmonary/Chest: Effort normal and breath sounds normal.  Abdominal: Soft. Bowel sounds are normal. He exhibits no distension. There is no tenderness.  Musculoskeletal: Normal range of motion. He exhibits no edema and no tenderness.  Neurological: He is alert and oriented to person, place, and time. He has normal reflexes. Coordination normal.  Motor strength 5 over 5 overall gait normal Romberg normal prior drift normal finger to nose normal  Skin: Skin is warm and dry. No rash noted.  Psychiatric: He has a normal mood and affect.    ED Course  Procedures (including critical care time) Labs Review Labs Reviewed - No data to display  Imaging Review No results found.  EKG Interpretation   Date/Time:  Friday Jun 07 2013 08:57:09 EDT Ventricular Rate:  90 PR Interval:  152 QRS Duration: 88 QT Interval:  360 QTC Calculation: 440 R Axis:   81 Text Interpretation:  Normal sinus rhythm Normal ECG No significant change  since last tracing Confirmed by Ethelda ChickJACUBOWITZ  MD, Ayaan Ringle [phone]) on  06/07/2013  9:47:14 AM      MDM  Chest pain highly atypical for acute coronary syndrome. Lasting a few seconds at a time. Did not come at same time is arm "burning". Normal EKG. No signs of central vertigo. Patient with nonfocal neurologic exam. Plan referral to primary care physician.:[facility] and resource guide Further evaluation and imaging not needed. Diagnosis #1 atypical chest pain #2 vertigo Final diagnoses:  None        Doug SouSam Kaytlin Burklow, MD 06/07/13 [phone]

## 2013-06-07 NOTE — ED Notes (Signed)
Pt reports on Tuesday he felt lightheaded and dizzy, like the room was spinning. sts over the past 2 weeks he has had intermittent burning and throbbing to left arm, denies anything making it worse or better. This morning woke up with sharp chest pain and HA. sts he has also been having some dizziness today too. Nad, skin warm and dry, resp e/u.

## 2013-06-07 NOTE — Discharge Instructions (Signed)
Dizziness Is okay to take Tylenol as needed for discomfort. Call the wellness Center or any of the numbers on the resource guide to get a primary care physician. Return if your condition worsens or if concerned for any reason Dizziness is a common problem. It is a feeling of unsteadiness or lightheadedness. You may feel like you are about to faint. Dizziness can lead to injury if you stumble or fall. A person of any age group can suffer from dizziness, but dizziness is more common in older adults. CAUSES  Dizziness can be caused by many different things, including:  Middle ear problems.  Standing for too long.  Infections.  An allergic reaction.  Aging.  An emotional response to something, such as the sight of blood.  Side effects of medicines.  Fatigue.  Problems with circulation or blood pressure.  Excess use of alcohol, medicines, or illegal drug use.  Breathing too fast (hyperventilation).  An arrhythmia or problems with your heart rhythm.  Low red blood cell count (anemia).  Pregnancy.  Vomiting, diarrhea, fever, or other illnesses that cause dehydration.  Diseases or conditions such as Parkinson's disease, high blood pressure (hypertension), diabetes, and thyroid problems.  Exposure to extreme heat. DIAGNOSIS  To find the cause of your dizziness, your caregiver may do a physical exam, lab tests, radiologic imaging scans, or an electrocardiography test (ECG).  TREATMENT  Treatment of dizziness depends on the cause of your symptoms and can vary greatly. HOME CARE INSTRUCTIONS   Drink enough fluids to keep your urine clear or pale yellow. This is especially important in very hot weather. In the elderly, it is also important in cold weather.  If your dizziness is caused by medicines, take them exactly as directed. When taking blood pressure medicines, it is especially important to get up slowly.  Rise slowly from chairs and steady yourself until you feel okay.  In  the morning, first sit up on the side of the bed. When this seems okay, stand slowly while holding onto something until you know your balance is fine.  If you need to stand in one place for a long time, be sure to move your legs often. Tighten and relax the muscles in your legs while standing.  If dizziness continues to be a problem, have someone stay with you for a day or two. Do this until you feel you are well enough to stay alone. Have the person call your caregiver if he or she notices changes in you that are concerning.  Do not drive or use heavy machinery if you feel dizzy.  Do not drink alcohol. SEEK IMMEDIATE MEDICAL CARE IF:   Your dizziness or lightheadedness gets worse.  You feel nauseous or vomit.  You develop problems with talking, walking, weakness, or using your arms, hands, or legs.  You are not thinking clearly or you have difficulty forming sentences. It may take a friend or family member to determine if your thinking is normal.  You develop chest pain, abdominal pain, shortness of breath, or sweating.  Your vision changes.  You notice any bleeding.  You have side effects from medicine that seems to be getting worse rather than better. MAKE SURE YOU:   Understand these instructions.  Will watch your condition.  Will get help right away if you are not doing well or get worse. Document Released: 07/06/2000 Document Revised: 04/04/2011 Document Reviewed: 07/30/2010 [facility] Patient Information 2014 [place], [place]. Chest Pain (Nonspecific) Chest pain has many causes. Your pain could  be caused by something serious, such as a heart attack or a blood clot in the lungs. It could also be caused by something less serious, such as a chest bruise or a virus. Follow up with your doctor. More lab tests or other studies may be needed to find the cause of your pain. Most of the time, nonspecific chest pain will improve within 2 to 3 days of rest and mild pain medicine. HOME  CARE  For chest bruises, you may put ice on the sore area for 15-20 minutes, 03-04 times a day. Do this only if it makes you feel better.  Put ice in a plastic bag.  Place a towel between the skin and the bag.  Rest for the next 2 to 3 days.  Go back to work if the pain improves.  See your doctor if the pain lasts longer than 1 to 2 weeks.  Only take medicine as told by your doctor.  Quit smoking if you smoke. GET HELP RIGHT AWAY IF:   There is more pain or pain that spreads to the arm, neck, jaw, back, or belly (abdomen).  You have shortness of breath.  You cough more than usual or cough up blood.  You have very bad back or belly pain, feel sick to your stomach (nauseous), or throw up (vomit).  You have very bad weakness.  You pass out (faint).  You have a fever. Any of these problems may be serious and may be an emergency. Do not wait to see if the problems will go away. Get medical help right away. Call your local emergency services 911 in [place] Do not drive yourself to the hospital. MAKE SURE YOU:   Understand these instructions.  Will watch this condition.  Will get help right away if you or your child is not doing well or gets worse. Document Released: 06/29/2007 Document Revised: 04/04/2011 Document Reviewed: 06/29/2007 [facility] Patient Information 2014 [place], [place].  Emergency Department Resource Guide 1) Find a Doctor and Pay Out of Pocket Although you won't have to find out who is covered by your insurance plan, it is a good idea to ask around and get recommendations. You will then need to call the office and see if the doctor you have chosen will accept you as a new patient and what types of options they offer for patients who are self-pay. Some doctors offer discounts or will set up payment plans for their patients who do not have insurance, but you will need to ask so you aren't surprised when you get to your appointment.  2) Contact Your Local Health  Department Not all health departments have doctors that can see patients for sick visits, but many do, so it is worth a call to see if yours does. If you don't know where your local health department is, you can check in your phone book. The CDC also has a tool to help you locate your state's health department, and many state websites also have listings of all of their local health departments.  3) Find a Walk-in Clinic If your illness is not likely to be very severe or complicated, you may want to try a walk in clinic. These are popping up all over the country in pharmacies, drugstores, and shopping centers. They're usually staffed by nurse practitioners or physician assistants that have been trained to treat common illnesses and complaints. They're usually fairly quick and inexpensive. However, if you have serious medical issues or chronic medical problems, these  are probably not your best option.  No Primary Care Doctor: - Call Health Connect at  [phone] - they can help you locate a primary care doctor that  accepts your insurance, provides certain services, etc. - Physician Referral Service- [phone]  Chronic Pain Problems: Organization         Address  Phone   Notes  [facility]  [phone] Patients need to be referred by their primary care doctor.   Medication Assistance: Organization         Address  Phone   Notes  [facility] Medication [facility] [address] [place], [place] [address] [phone] --Must be a resident of [facility] [place] -- Must have NO insurance coverage whatsoever (no Medicaid/ Medicare, etc.) -- The pt. MUST have a primary care doctor that directs their care regularly and follows them in the community   MedAssist  [phone]   [employer]  [phone]    Agencies that provide inexpensive medical care: Organization         Address  Phone   Notes  [facility]  [phone]   [facility]    [phone]   [facility] [address] [place], [place] [address] [phone]   [facility] [address], [place] [phone]   [facility]anned Parenthood    [phone]   [facility]    [phone]   [facility]  [address], [place] Phone:  [phone], Fax:  [phone] Hours of Operation:  9 am - 6 pm, M-F.  Also accepts Medicaid/Medicare and self-pay.  [facility]  [address], [place] Phone: [phone], Fax: [phone]. Hours of Operation:  8:30 am - 5:30 pm, M-F.  Also accepts Medicaid and self-pay.  [facility] [place] [address], [place] Phone: [phone]) [phone]   [facility] [address], [place], [place] [phone], Ext. [phone] Mondays & Thursdays: 7-9 AM.  First 15 patients are seen on a first come, first serve basis.    Medicaid-accepting [facility] Providers:  Organization         Address  Phone   Notes  [facility] [address],  [phone] Also accepts self-pay patients.  [facility] [address], [place]  [phone]   [facility] [address], [place] [phone]   [facility] [address] [place] [address], [place] [phone]   Renaye RakersVeita Bland [address], [place]   [phone] Only accepts [place] Access [place] patients after they have their name applied to their card.   Self-Pay (no insurance) in [facility] [place]:  Organization         Address  Phone   Notes  Sickle Cell Patients, [facility] [address], [place] [phone]   [facility] [address], [place] [phone]   [facility] [place]  [address],  [place] [phone]   [facility]/Dr. Osei-Bonsu  [address] [place] [address], [place] or [phone], [place] [phone] Phone number for both [place] and [place] locations is the same.  [facility] [address], [place] [phone]   [facility] [address] [place] or [address] [phone]   [facility] [address] [place] [phone], phone; [phone], fax Sees patients 1st and 3rd Saturday of every month.  Must not qualify for public or private insurance (i.e. Medicaid, Medicare, [place] Health Choice, Veterans' Benefits)  Household income should be no more than 200% of the poverty level The clinic cannot treat you if you are pregnant or think you are pregnant  Sexually transmitted diseases are not treated at the clinic.    Dental Care: Organization         Address  Phone  Notes  [facility] of [facility] [address], [place] [phone] Accepts children up to age 3 who are enrolled in [place] or [place] Health Choice; pregnant women with a Medicaid card; and children who have applied for Medicaid or [place] Health Choice, but were declined, whose parents can pay a reduced fee at time of service.  [facility]  [address], [place] [phone] Accepts children up to age 45 who are enrolled in [place] or [place] Health Choice; pregnant women with a Medicaid card; and children who have applied for Medicaid or [place] Health Choice, but were declined, whose parents can pay a reduced fee at time of service.  [facility]  [address] [place], [place] [phone] Patients are seen by appointment only. Walk-ins are not accepted. [facility] will see patients 44 years of age and older. Monday - Tuesday (8am-5pm) Most Wednesdays  (8:30-5pm) $30 per visit, cash only  [facility] Access PRO[facility]  [address], [facility] [phone] Patients are seen by appointment only. Walk-ins are not accepted. [facility] will see patients 87 years of age and older. One Wednesday Evening (Monthly: Volunteer Based).  $30 per visit, cash only  [employer]  [phone] for adults; Children under age 61, call Graduate Pediatric Dentistry at [phone]. Children aged 51-14, please call [phone] to request a pediatric application.  Dental services are provided in all areas of dental care including fillings, crowns and bridges, complete and partial dentures, implants, gum treatment, root canals, and extractions. Preventive care is also provided. Treatment is provided to both adults and children. Patients are selected via a lottery and there is often a waiting list.   [facility] [address], [place]  [phone] [email or web address]   [facility] [address] [place], [place] [phone], Ext. [phone] Second and Fourth Thursday of each month, opens at 6:30 AM; Clinic ends at 9 AM.  Patients are seen on a first-come first-served basis, and a limited number are seen during each clinic.   [facility]  [address] [place], [place] [phone]   Eligibility Requirements You must have lived in [place], [place], or [place] counties for at least the last three months.   You cannot be eligible for state or federal sponsored [place], including [employer], [place], or [employer].   You generally cannot be eligible for healthcare insurance through your employer.    How to apply: Eligibility screenings are held every Tuesday and Wednesday afternoon from 1:00 pm until 4:00 pm. You do not need an appointment for the interview!  [facility]  [address], [place], [place] [phone]   [facility]  Health Department  [phone]   [facility] Health Department  [phone]   [facility] [address] Health Department  [phone]    Behavioral Health Resources in the Community: Intensive Outpatient Programs Organization         Address  Phone  Notes  [facility] Services [address], [place], [place] [phone]   [facility] [address] [place], [place] [phone]   [facility] [address] [place], [place]  [phone]   [facility] [address],  [place], [place] [phone] or [phone]   Substance Abuse Resources Organization         Address  Phone  Notes  [facility] Drug [facility]  [phone]   [facility]  [phone]   [facility]  [phone]   Floydene Flock  [phone]   Residential & Outpatient Substance Abuse Program  [phone]   Psychological Services Organization         Address  Phone  Notes  [facility]  [phone]   [facility]  [phone]   [facility] [address] [place] [phone] or [phone]    Mobile Crisis Teams Organization         Address  Phone  Notes  [facility], Mobile Crisis Care Unit  [phone]   [facility]  [address] [place], [place] [phone]   Doristine Locks [address] [place] [phone]    Self-Help/Support Groups Organization         Address  Phone             Notes  [facility] of [place] - variety of support groups  [phone]- [phone] Call for more information  Narcotics Anonymous (NA), [facility] [address], [employer] [place]  2 meetings at this location   Statistician         Address  Phone  Notes  [facility] [address],    [place]  [phone]   [facility]  [address] [place] [address], [place], [place]  [phone]   [facility] [address] [place], [place] [phone] Admissions: 8am-3pm M-F  [facility] [address]    [place], [place] [phone]   [facility] [address] [place], [place], [place] [phone]   [facility] [address]  [place], [place] [phone]   [facility] - Intensive Outpatient [address], [place], [place] [phone]   [facility] ([facility] [address] [place].,  [place], [place] [phone] or [phone]   [facility] ([facility]) [address], [place], [place] [phone] Accepts Medicaid  [facility] [place] [address],  [place] [phone] Substance Abuse/Addiction Treatment   [facility] Organization         Address  Phone  Notes  [facility]  [phone]   Angie Fava, PhD [address] Kn[address] [place], [place]   [phone] or [phone]   [facility]   [address] [place], [place] [phone]   [facility] [address], [place], [place] [phone] Insurance/Medicaid/sponsorship through [employer] and Families [address]  [place], [place] [phone] [place], [place] [phone]    Dr. Adele Schilder  [phone]   [facility] Dept. 1) [address], [place] 2) [place] 3)  [address] [place] [address], [place] [phone]  [phone]   [place] [phone] or [phone] (After Hours)

## 2013-06-07 NOTE — ED Notes (Signed)
Pt reports chest pain and numbness in the left arm that started a couple of days ago. Reports that he has also been having dizzy episodes at home. Denies any weakness at this time. Pt reports a headache this morning before heading to work.

## 2013-06-07 NOTE — ED Notes (Addendum)
Collene Maresrystal Steffy, pt's wife, cell phone # [phone]. Pt would like wife to be informed and if need be make any decisions for him. Pt's brother Carles ColletJermaine Temme - [phone]- [phone]

## 2013-07-05 ENCOUNTER — Emergency Department (HOSPITAL_COMMUNITY)
Admission: EM | Admit: 2013-07-05 | Discharge: 2013-07-05 | Disposition: A | Discharge status: Home / Self Care | Payer: BC Managed Care – PPO | Attending: Emergency Medicine | Admitting: Emergency Medicine

## 2013-07-05 ENCOUNTER — Encounter (HOSPITAL_COMMUNITY): Payer: Self-pay | Admitting: Emergency Medicine

## 2013-07-05 DIAGNOSIS — R350 Frequency of micturition: Secondary | ICD-10-CM | POA: Insufficient documentation

## 2013-07-05 DIAGNOSIS — R109 Unspecified abdominal pain: Secondary | ICD-10-CM | POA: Insufficient documentation

## 2013-07-05 DIAGNOSIS — Z79899 Other long term (current) drug therapy: Secondary | ICD-10-CM | POA: Insufficient documentation

## 2013-07-05 DIAGNOSIS — R209 Unspecified disturbances of skin sensation: Secondary | ICD-10-CM | POA: Insufficient documentation

## 2013-07-05 DIAGNOSIS — M549 Dorsalgia, unspecified: Secondary | ICD-10-CM

## 2013-07-05 DIAGNOSIS — M545 Low back pain, unspecified: Secondary | ICD-10-CM | POA: Insufficient documentation

## 2013-07-05 LAB — URINALYSIS, ROUTINE W REFLEX MICROSCOPIC
BILIRUBIN URINE: NEGATIVE
GLUCOSE, UA: NEGATIVE mg/dL
Hgb urine dipstick: NEGATIVE
KETONES UR: NEGATIVE mg/dL
Leukocytes, UA: NEGATIVE
NITRITE: NEGATIVE
PH: 6.5 (ref 5.0–8.0)
PROTEIN: NEGATIVE mg/dL
Specific Gravity, Urine: 1.035 — ABNORMAL HIGH (ref 1.005–1.030)
Urobilinogen, UA: 1 mg/dL (ref 0.0–1.0)

## 2013-07-05 MED ORDER — OXYCODONE-ACETAMINOPHEN 5-325 MG PO TABS: 1.0000 | ORAL_TABLET | Freq: Four times a day (QID) | ORAL | Status: DC | PRN

## 2013-07-05 NOTE — ED Notes (Signed)
Pt reports lower back pain that begin in mid-May, however it has progressed and worsened today. Pt reports history of back pain and had two back surgeries. Pt states the pain radiates down both legs. Pt is A/O x4, in NAD, and vitals are WDL.

## 2013-07-05 NOTE — Discharge Instructions (Signed)
Back Pain, Adult Low back pain is very common. About 1 in 5 people have back pain.The cause of low back pain is rarely dangerous. The pain often gets better over time.About half of people with a sudden onset of back pain feel better in just 2 weeks. About 8 in 10 people feel better by 6 weeks.  CAUSES Some common causes of back pain include:  Strain of the muscles or ligaments supporting the spine.  Wear and tear (degeneration) of the spinal discs.  Arthritis.  Direct injury to the back. DIAGNOSIS Most of the time, the direct cause of low back pain is not known.However, back pain can be treated effectively even when the exact cause of the pain is unknown.Answering your caregiver's questions about your overall health and symptoms is one of the most accurate ways to make sure the cause of your pain is not dangerous. If your caregiver needs more information, he or she may order lab work or imaging tests (X-rays or MRIs).However, even if imaging tests show changes in your back, this usually does not require surgery. HOME CARE INSTRUCTIONS For many people, back pain returns.Since low back pain is rarely dangerous, it is often a condition that people can learn to manageon their own.   Remain active. It is stressful on the back to sit or stand in one place. Do not sit, drive, or stand in one place for more than 30 minutes at a time. Take short walks on level surfaces as soon as pain allows.Try to increase the length of time you walk each day.  Do not stay in bed.Resting more than 1 or 2 days can delay your recovery.  Do not avoid exercise or work.Your body is made to move.It is not dangerous to be active, even though your back may hurt.Your back will likely heal faster if you return to being active before your pain is gone.  Pay attention to your body when you bend and lift. Many people have less discomfortwhen lifting if they bend their knees, keep the load close to their bodies,and  avoid twisting. Often, the most comfortable positions are those that put less stress on your recovering back.  Find a comfortable position to sleep. Use a firm mattress and lie on your side with your knees slightly bent. If you lie on your back, put a pillow under your knees.  Only take over-the-counter or prescription medicines as directed by your caregiver. Over-the-counter medicines to reduce pain and inflammation are often the most helpful.Your caregiver may prescribe muscle relaxant drugs.These medicines help dull your pain so you can more quickly return to your normal activities and healthy exercise.  Put ice on the injured area.  Put ice in a plastic bag.  Place a towel between your skin and the bag.  Leave the ice on for 15-20 minutes, 03-04 times a day for the first 2 to 3 days. After that, ice and heat may be alternated to reduce pain and spasms.  Ask your caregiver about trying back exercises and gentle massage. This may be of some benefit.  Avoid feeling anxious or stressed.Stress increases muscle tension and can worsen back pain.It is important to recognize when you are anxious or stressed and learn ways to manage it.Exercise is a great option. SEEK MEDICAL CARE IF:  You have pain that is not relieved with rest or medicine.  You have pain that does not improve in 1 week.  You have new symptoms.  You are generally not feeling well. SEEK   IMMEDIATE MEDICAL CARE IF:   You have pain that radiates from your back into your legs.  You develop new bowel or bladder control problems.  You have unusual weakness or numbness in your arms or legs.  You develop nausea or vomiting.  You develop abdominal pain.  You feel faint. Document Released: 01/10/2005 Document Revised: 07/12/2011 Document Reviewed: 05/31/2010 ExitCare Patient Information 2014 Exit[employer], [employer].  

## 2013-07-05 NOTE — ED Provider Notes (Signed)
Medical screening examination/treatment/procedure(s) were performed by non-physician practitioner and as supervising physician I was immediately available for consultation/collaboration.   EKG Interpretation None        Junius ArgyleForrest S Joshawn Crissman, MD 07/05/13 1846

## 2013-07-05 NOTE — ED Provider Notes (Signed)
CSN: [id number]     Arrival date & time 07/05/13  1524 History  This chart was scribed for non-physician practitioner, Teressa LowerVrinda Courteney Alderete, FNP,working with Junius ArgyleForrest S Harrison, MD, by Karle PlumberJennifer Tensley, ED Scribe.  This patient was seen in room WTR9/WTR9 and the patient's care was started at 3:43 PM.  Chief Complaint  Patient presents with  . Back Pain   The history is provided by the patient. No language interpreter was used.   HPI Comments:  Andre LevinsKevin L Walters is a 43 y.o. male with h/o lumbar back surgeries who presents to the Emergency Department complaining of worsening back pain that started initially one month ago. Pt states the pain increased today and he is having trouble sitting, squatting, and bending. He reports associated tingling in his right foot and states he intermittently has tingling in his left foot as well. He reports increased urination as well and abdominal pain. Pt states he has been taking Ibuprofen and Vicodin for the pain. He denies fever, any bowel or bladder incontinence or numbness. Pt states his surgeon is Dr. Tressie StalkerJeffrey Jenkins and has an appointment with him coming up next month. Pt is ambulatory without issue.  History reviewed. No pertinent past medical history. Past Surgical History  Procedure Laterality Date  . Hernia repair     Family History  Problem Relation Age of Onset  . Hypertension Mother    History  Substance Use Topics  . Smoking status: Never Smoker   . Smokeless tobacco: Not on file  . Alcohol Use: Yes    Review of Systems  Constitutional: Negative for fever.  Gastrointestinal: Positive for abdominal pain.  Genitourinary: Positive for frequency.  Musculoskeletal: Positive for back pain.  Neurological: Negative for numbness.  All other systems reviewed and are negative.   Allergies  Hydrocodone  Home Medications   Prior to Admission medications   Medication Sig Start Date End Date Taking? Authorizing Provider  cetirizine (ZYRTEC) 10 MG  tablet Take 10 mg by mouth daily.    Historical Provider, MD  Naphazoline HCl (CLEAR EYES OP) Apply 1 drop to eye daily as needed (for dry, itchy eyes).    Historical Provider, MD   Triage Vitals: BP 135/90  Pulse 104  Temp(Src) 98.2 F (36.8 C) (Oral)  Resp 18  SpO2 96% Physical Exam  Nursing note and vitals reviewed. Constitutional: He is oriented to person, place, and time. He appears well-developed and well-nourished.  HENT:  Head: Normocephalic and atraumatic.  Eyes: EOM are normal.  Neck: Normal range of motion.  Cardiovascular: Normal rate.   Pulmonary/Chest: Effort normal.  Musculoskeletal: Normal range of motion.  Lumbar spine and paraspinal tenderness:full rom. Equal strength  Neurological: He is alert and oriented to person, place, and time. He exhibits normal muscle tone. Coordination normal.  Skin: Skin is warm and dry.  Psychiatric: He has a normal mood and affect. His behavior is normal.    ED Course  Procedures (including critical care time) DIAGNOSTIC STUDIES: Oxygen Saturation is 96% on RA, adequate by my interpretation.   COORDINATION OF CARE: 3:48 PM- Will order urinalysis. Pt verbalizes understanding and agrees to plan.  Medications - No data to display  Labs Review Labs Reviewed  URINALYSIS, ROUTINE W REFLEX MICROSCOPIC - Abnormal; Notable for the following:    Specific Gravity, Urine 1.035 (*)    All other components within normal limits    Imaging Review No results found.   EKG Interpretation None      MDM   Final  diagnoses:  Back pain   Pt is neurologically intact. Pt has appointment with [facility]. Pt is okay to follow up  I personally performed the services described in this documentation, which was scribed in my presence. The recorded information has been reviewed and is accurate.    Teressa LowerVrinda Kiki Bivens, NP 07/05/13 1642

## 2013-07-11 ENCOUNTER — Emergency Department (HOSPITAL_COMMUNITY)
Admission: EM | Admit: 2013-07-11 | Discharge: 2013-07-11 | Disposition: A | Discharge status: Home / Self Care | Payer: BC Managed Care – PPO | Attending: Emergency Medicine | Admitting: Emergency Medicine

## 2013-07-11 ENCOUNTER — Encounter (HOSPITAL_COMMUNITY): Payer: Self-pay | Admitting: Emergency Medicine

## 2013-07-11 ENCOUNTER — Emergency Department (HOSPITAL_COMMUNITY): Payer: BC Managed Care – PPO

## 2013-07-11 DIAGNOSIS — Z79899 Other long term (current) drug therapy: Secondary | ICD-10-CM | POA: Insufficient documentation

## 2013-07-11 DIAGNOSIS — R109 Unspecified abdominal pain: Secondary | ICD-10-CM | POA: Insufficient documentation

## 2013-07-11 DIAGNOSIS — Z885 Allergy status to narcotic agent status: Secondary | ICD-10-CM | POA: Insufficient documentation

## 2013-07-11 LAB — CBC WITH DIFFERENTIAL/PLATELET
BASOS ABS: 0 10*3/uL (ref 0.0–0.1)
BASOS PCT: 0 % (ref 0–1)
EOS ABS: 0 10*3/uL (ref 0.0–0.7)
Eosinophils Relative: 1 % (ref 0–5)
HCT: 41.4 % (ref 39.0–52.0)
Hemoglobin: 14.2 g/dL (ref 13.0–17.0)
Lymphocytes Relative: 38 % (ref 12–46)
Lymphs Abs: 1.4 10*3/uL (ref 0.7–4.0)
MCH: 31.1 pg (ref 26.0–34.0)
MCHC: 34.3 g/dL (ref 30.0–36.0)
MCV: 90.6 fL (ref 78.0–100.0)
MONO ABS: 0.5 10*3/uL (ref 0.1–1.0)
Monocytes Relative: 12 % (ref 3–12)
NEUTROS PCT: 49 % (ref 43–77)
Neutro Abs: 1.8 10*3/uL (ref 1.7–7.7)
Platelets: 251 10*3/uL (ref 150–400)
RBC: 4.57 MIL/uL (ref 4.22–5.81)
RDW: 11.5 % (ref 11.5–15.5)
WBC: 3.8 10*3/uL — ABNORMAL LOW (ref 4.0–10.5)

## 2013-07-11 LAB — COMPREHENSIVE METABOLIC PANEL
ALBUMIN: 4.3 g/dL (ref 3.5–5.2)
ALT: 32 U/L (ref 0–53)
AST: 24 U/L (ref 0–37)
Alkaline Phosphatase: 74 U/L (ref 39–117)
BILIRUBIN TOTAL: 0.6 mg/dL (ref 0.3–1.2)
BUN: 11 mg/dL (ref 6–23)
CHLORIDE: 101 meq/L (ref 96–112)
CO2: 23 mEq/L (ref 19–32)
CREATININE: 0.84 mg/dL (ref 0.50–1.35)
Calcium: 9.5 mg/dL (ref 8.4–10.5)
GFR calc Af Amer: >90 mL/min (ref >90)
GFR calc non Af Amer: >90 mL/min (ref >90)
Glucose, Bld: 97 mg/dL (ref 70–99)
POTASSIUM: 4 meq/L (ref 3.7–5.3)
Sodium: 138 mEq/L (ref 137–147)
TOTAL PROTEIN: 8 g/dL (ref 6.0–8.3)

## 2013-07-11 LAB — URINALYSIS, ROUTINE W REFLEX MICROSCOPIC
Bilirubin Urine: NEGATIVE
GLUCOSE, UA: NEGATIVE mg/dL
Hgb urine dipstick: NEGATIVE
KETONES UR: NEGATIVE mg/dL
Leukocytes, UA: NEGATIVE
Nitrite: NEGATIVE
Protein, ur: NEGATIVE mg/dL
Specific Gravity, Urine: 1.015 (ref 1.005–1.030)
Urobilinogen, UA: 0.2 mg/dL (ref 0.0–1.0)
pH: 7.5 (ref 5.0–8.0)

## 2013-07-11 LAB — LIPASE, BLOOD: Lipase: 29 U/L (ref 11–59)

## 2013-07-11 MED ORDER — MAGNESIUM CITRATE PO SOLN: 1.0000 | Freq: Once | ORAL | Status: DC

## 2013-07-11 MED ORDER — OMEPRAZOLE 20 MG PO CPDR: 20.0000 mg | DELAYED_RELEASE_CAPSULE | Freq: Every day | ORAL | Status: DC

## 2013-07-11 MED ORDER — FAMOTIDINE 20 MG PO TABS: 20.0000 mg | ORAL_TABLET | Freq: Two times a day (BID) | ORAL | Status: DC

## 2013-07-11 MED ORDER — SUCRALFATE 1 G PO TABS: 1.0000 g | ORAL_TABLET | Freq: Three times a day (TID) | ORAL | Status: DC

## 2013-07-11 NOTE — Discharge Instructions (Signed)
Start taking prilosec and pepcid daily. Take carafate as needed with food. Mag citrate for constipation. Follow up with primary care doctor for recheck if symptoms not improving.    Abdominal Pain Many things can cause abdominal pain. Usually, abdominal pain is not caused by a disease and will improve without treatment. It can often be observed and treated at home. Your health care provider will do a physical exam and possibly order blood tests and X-rays to help determine the seriousness of your pain. However, in many cases, more time must pass before a clear cause of the pain can be found. Before that point, your health care provider may not know if you need more testing or further treatment. HOME CARE INSTRUCTIONS  Monitor your abdominal pain for any changes. The following actions may help to alleviate any discomfort you are experiencing:  Only take over-the-counter or prescription medicines as directed by your health care provider.  Do not take laxatives unless directed to do so by your health care provider.  Try a clear liquid diet (broth, tea, or water) as directed by your health care provider. Slowly move to a bland diet as tolerated. SEEK MEDICAL CARE IF:  You have unexplained abdominal pain.  You have abdominal pain associated with nausea or diarrhea.  You have pain when you urinate or have a bowel movement.  You experience abdominal pain that wakes you in the night.  You have abdominal pain that is worsened or improved by eating food.  You have abdominal pain that is worsened with eating fatty foods.  You have a fever. SEEK IMMEDIATE MEDICAL CARE IF:   Your pain does not go away within 2 hours.  You keep throwing up (vomiting).  Your pain is felt only in portions of the abdomen, such as the right side or the left lower portion of the abdomen.  You pass bloody or black tarry stools. MAKE SURE YOU:  Understand these instructions.   Will watch your condition.    Will get help right away if you are not doing well or get worse.  Document Released: 10/20/2004 Document Revised: 01/15/2013 Document Reviewed: 09/19/2012 [facility] Patient Information 2015 [employer] [place] [place]. This information is not intended to replace advice given to you by your health care provider. Make sure you discuss any questions you have with your health care provider.

## 2013-07-11 NOTE — ED Notes (Signed)
US at bedside

## 2013-07-11 NOTE — ED Notes (Signed)
Patient transported to X-ray 

## 2013-07-11 NOTE — ED Provider Notes (Signed)
CSN: [id number]     Arrival date & time 07/11/13  [id number] History   First MD Initiated Contact with Patient 07/11/13 [phone]     Chief Complaint  Patient presents with  . Flank Pain     (Consider location/radiation/quality/duration/timing/severity/associated sxs/prior Treatment) HPI Andre Walters is a 43 y.o. male who presents to ED with complaint of back pain and abdominal pain. Pt states pain began several days ago. States hx of back surgeries. Was seen  Here for pain control, states given percocet which is helping, however, pt states pain is not more in his stomach. States has had some constipation over the weekend, reports not having a bowel movement for 3 days which is unusual for him, but states was taking percocet and he did have a bowel movement this morning which was very light in color. Pt states pain pain is worsened with movement and eating. Pain radiates mainly from right flank into right upper abdomen. Pain comes and goes. No pain in extremities. No urinary symptoms. Denies prior abdominal surgeries. Denies frequent alcohol use.    History reviewed. No pertinent past medical history. Past Surgical History  Procedure Laterality Date  . Hernia repair     Family History  Problem Relation Age of Onset  . Hypertension Mother    History  Substance Use Topics  . Smoking status: Never Smoker   . Smokeless tobacco: Not on file  . Alcohol Use: Yes    Review of Systems  Constitutional: Negative for fever and chills.  Respiratory: Negative for cough, chest tightness and shortness of breath.   Cardiovascular: Negative for chest pain, palpitations and leg swelling.  Gastrointestinal: Negative for nausea, vomiting, abdominal pain, diarrhea and abdominal distention.  Genitourinary: Negative for dysuria, urgency, frequency and hematuria.  Musculoskeletal: Negative for arthralgias, myalgias, neck pain and neck stiffness.  Skin: Negative for rash.  Allergic/Immunologic: Negative for  immunocompromised state.  Neurological: Negative for dizziness, weakness, light-headedness, numbness and headaches.      Allergies  Hydrocodone  Home Medications   Prior to Admission medications   Medication Sig Start Date End Date Taking? Authorizing Provider  cetirizine (ZYRTEC) 10 MG tablet Take 10 mg by mouth daily.    Historical Provider, MD  Naphazoline HCl (CLEAR EYES OP) Apply 1 drop to eye daily as needed (for dry, itchy eyes).    Historical Provider, MD  oxyCODONE-acetaminophen (PERCOCET/ROXICET) 5-325 MG per tablet Take 1-2 tablets by mouth every 6 (six) hours as needed for moderate pain or severe pain. 07/05/13   Teressa LowerVrinda Pickering, NP   BP 135/84  Pulse 91  Temp(Src) 98.3 F (36.8 C) (Oral)  Resp 16  SpO2 99% Physical Exam  Nursing note and vitals reviewed. Constitutional: He appears well-developed and well-nourished. No distress.  HENT:  Head: Normocephalic and atraumatic.  Eyes: Conjunctivae are normal.  Neck: Neck supple.  Cardiovascular: Normal rate, regular rhythm and normal heart sounds.   Pulmonary/Chest: Effort normal. No respiratory distress. He has no wheezes. He has no rales.  Abdominal: Soft. Bowel sounds are normal. He exhibits no distension. There is tenderness. There is no rebound.  Epigastric and LUQ tenderness. No CVA tenderness bialterally  Musculoskeletal: He exhibits no edema.  Neurological: He is alert.  Skin: Skin is warm and dry.    ED Course  Procedures (including critical care time) Labs Review Labs Reviewed  CBC WITH DIFFERENTIAL - Abnormal; Notable for the following:    WBC 3.8 (*)    All other components within normal limits  COMPREHENSIVE METABOLIC PANEL  LIPASE, BLOOD  URINALYSIS, ROUTINE W REFLEX MICROSCOPIC    Imaging Review [place] Abdomen Complete  07/11/2013   CLINICAL DATA:  FLANK PAIN  EXAM: ULTRASOUND ABDOMEN COMPLETE  COMPARISON:  None.  FINDINGS: Gallbladder:  No gallbladder wall thickening visualized, measuring 2 mm. A  6 mm echogenic mural based focus along the mid gallbladder wall. There is no appreciable acoustic shadowing. Differential considerations are polyp versus a small adherent gallstone. No sonographic Murphy sign noted.  Common bile duct:  Diameter: 4 mm  Liver:  No focal lesion identified. Within normal limits in parenchymal echogenicity.  IVC:  No abnormality visualized.  Pancreas:  Visualized portion unremarkable.  Spleen:  Size and appearance within normal limits.  Right Kidney:  Length: 11 cm. Echogenicity within normal limits. No mass or hydronephrosis visualized.  Left Kidney:  Length: 11.4 cm. Echogenicity within normal limits. No mass or hydronephrosis visualized.  Abdominal aorta:  No aneurysm visualized.  Other findings:  None.  IMPRESSION: Small gallstone versus a polyp. Otherwise unremarkable abdominal ultrasound.   Electronically Signed   By: Salome HolmesHector  Cooper M.D.   On: 07/11/2013 08:31   Dg Abd 2 Views  07/11/2013   CLINICAL DATA:  Low back and abdominal pain with constipation ; history of previous back surgeries  EXAM: ABDOMEN - 2 VIEW  COMPARISON:  None.  FINDINGS: The bowel gas pattern is nonspecific. There small amounts of small bowel gas to the left of midline. The colonic stool and gas pattern is within the limits of normal. There are no abnormal soft tissue calcifications. There are fusion changes at the L4-5 level. The lung bases are clear.  IMPRESSION: The bowel gas pattern is nonspecific with no evidence of obstruction.   Electronically Signed   By: David  SwazilandJordan   On: 07/11/2013 08:56     EKG Interpretation None      MDM   Final diagnoses:  Abdominal pain    Pt with epigastric and left upper quadrant pain, radiating into right flank. Will get labs. US to evaluate gallbladder given hx of postprandial pain and nausea.   9:16 AM US negative other than possible small gallstone vs a polyp. Acute abd obtained also unremarkable. Labs normal. Abdomen is soft, no peritoneal signs. Do  not think further imaging indicated at this time. Will restart prilosec, add pepcid, carafate. Avoid spicy or greasy foods. Follow up with pcp.   Filed Vitals:   07/11/13 0929  BP: 135/97  Pulse: 87  Temp:   Resp: 17     Carmellia Kreisler A Donica Derouin, PA-C 07/11/13 1647  Logun Colavito A Teo Moede, PA-C 07/11/13 1647

## 2013-07-11 NOTE — ED Notes (Signed)
Pt arrived to the ED with a complaint a complaint of right sided flank pain that radiates to the abdomen.  Pt is also experiencing urinary difficulty.  Pt has had back surgery and thought it was that but the pain has moved making him reconsider his previous thought pattern.  Pt states he has a full feeling in his stomach.

## 2013-07-12 NOTE — ED Provider Notes (Signed)
Medical screening examination/treatment/procedure(s) were performed by non-physician practitioner and as supervising physician I was immediately available for consultation/collaboration.   EKG Interpretation None        Lyanne CoKevin M Frenchie Pribyl, MD 07/12/13 [id number]

## 2013-08-28 ENCOUNTER — Emergency Department (HOSPITAL_COMMUNITY)
Admission: EM | Admit: 2013-08-28 | Discharge: 2013-08-28 | Disposition: A | Discharge status: Home / Self Care | Payer: BC Managed Care – PPO | Attending: Emergency Medicine | Admitting: Emergency Medicine

## 2013-08-28 ENCOUNTER — Encounter (HOSPITAL_COMMUNITY): Payer: Self-pay | Admitting: Emergency Medicine

## 2013-08-28 DIAGNOSIS — Y9389 Activity, other specified: Secondary | ICD-10-CM | POA: Insufficient documentation

## 2013-08-28 DIAGNOSIS — IMO0002 Reserved for concepts with insufficient information to code with codable children: Secondary | ICD-10-CM | POA: Insufficient documentation

## 2013-08-28 DIAGNOSIS — Y92410 Unspecified street and highway as the place of occurrence of the external cause: Secondary | ICD-10-CM | POA: Insufficient documentation

## 2013-08-28 DIAGNOSIS — Z79899 Other long term (current) drug therapy: Secondary | ICD-10-CM | POA: Insufficient documentation

## 2013-08-28 DIAGNOSIS — Z9889 Other specified postprocedural states: Secondary | ICD-10-CM | POA: Insufficient documentation

## 2013-08-28 MED ORDER — METHOCARBAMOL 500 MG PO TABS: 500.0000 mg | ORAL_TABLET | Freq: Two times a day (BID) | ORAL | Status: DC

## 2013-08-28 MED ORDER — IBUPROFEN 800 MG PO TABS: 800.0000 mg | ORAL_TABLET | Freq: Three times a day (TID) | ORAL | Status: DC
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
## 2013-08-28 NOTE — ED Provider Notes (Signed)
Medical screening examination/treatment/procedure(s) were performed by non-physician practitioner and as supervising physician I was immediately available for consultation/collaboration.   EKG Interpretation None        Audree Camel, MD 08/28/13 2224

## 2013-08-28 NOTE — Discharge Instructions (Signed)
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
## 2014-03-28 ENCOUNTER — Ambulatory Visit (INDEPENDENT_AMBULATORY_CARE_PROVIDER_SITE_OTHER): Payer: BLUE CROSS/BLUE SHIELD | Admitting: Physician Assistant

## 2014-03-28 ENCOUNTER — Ambulatory Visit (INDEPENDENT_AMBULATORY_CARE_PROVIDER_SITE_OTHER): Payer: BLUE CROSS/BLUE SHIELD

## 2014-03-28 VITALS — BP 110/78 | HR 79 | Temp 97.7°F | Resp 16 | Ht 72.0 in | Wt 180.2 lb

## 2014-03-28 DIAGNOSIS — M25521 Pain in right elbow: Secondary | ICD-10-CM | POA: Diagnosis not present

## 2014-03-28 MED ORDER — IBUPROFEN 600 MG PO TABS: 600.0000 mg | ORAL_TABLET | Freq: Three times a day (TID) | ORAL | Status: DC | PRN

## 2014-03-28 MED ORDER — TRAMADOL HCL 50 MG PO TABS: 50.0000 mg | ORAL_TABLET | Freq: Three times a day (TID) | ORAL | Status: DC | PRN

## 2014-03-28 NOTE — Progress Notes (Signed)
   Subjective:    Patient ID: Andre Walters, male    DOB: 08-08-1970, 44 y.o.   MRN: [id number]  HPI Patient that is R hand dominant presents for R elbow pain that has been present for 1 week. Pain radiates down to hand and has had tingling in elbow and fingers. Elbow pain is constant, but worse when bends elbow. Denies swelling, redness, loss of ROM/fxn/sensation or back/neck/shoulder pain. Arm has not experienced any numbness. Denies dropping things, but feels like arm is weaker. Tried wrapping arm without relief and has not tried any medication for relief. Injured elbow in 2000 at work by hitting elbow on steel and was told he had damaged a nerve, but recovered despite not getting recommended surgery. Hit elbow last week on machine at work and has been moving into new home. Pain not as bad as in 2000.    Review of Systems  Musculoskeletal: Positive for arthralgias. Negative for myalgias, back pain, joint swelling, neck pain and neck stiffness.  Neurological: Positive for weakness. Negative for dizziness, numbness and headaches.       Objective:   Physical Exam  Constitutional: He is oriented to person, place, and time. He appears well-developed and well-nourished. No distress.  Blood pressure 110/78, pulse 79, temperature 97.7 F (36.5 C), temperature source Oral, resp. rate 16, height 6' (1.829 m), weight 180 lb 3.2 oz (81.738 kg), SpO2 99 %.  HENT:  Head: Normocephalic and atraumatic.  Right Ear: External ear normal.  Left Ear: External ear normal.  Mouth/Throat: Oropharynx is clear and moist.  Eyes: Conjunctivae and EOM are normal. Pupils are equal, round, and reactive to light. Right eye exhibits no discharge. Left eye exhibits no discharge. No scleral icterus.  Neck: Normal range of motion. Neck supple.  Cardiovascular: Normal rate, regular rhythm and normal heart sounds.  Exam reveals no gallop and no friction rub.   No murmur heard. Pulmonary/Chest: Effort normal and breath  sounds normal. No respiratory distress. He has no wheezes. He has no rales.  Musculoskeletal: Normal range of motion. He exhibits tenderness. He exhibits no edema.       Right elbow: He exhibits normal range of motion, no swelling, no effusion, no deformity and no laceration. Tenderness found. Medial epicondyle and lateral epicondyle tenderness noted. No radial head and no olecranon process tenderness noted.  Lymphadenopathy:    He has no cervical adenopathy.  Neurological: He is alert and oriented to person, place, and time. He has normal strength and normal reflexes. He displays no atrophy and no tremor. No cranial nerve deficit or sensory deficit. He exhibits normal muscle tone. Coordination normal.  Skin: Skin is warm and dry. No rash noted. He is not diaphoretic. No erythema. No pallor.   [facility] reading (PRIMARY) by  Dr. Patsy Lager[place]. No acute fracture.     Assessment & Plan:  1. Elbow pain, right Ice and rest. Note written to return to work 03/30/14. - DG Elbow Complete Right; Future - ibuprofen (ADVIL,MOTRIN) 600 MG tablet; Take 1 tablet (600 mg total) by mouth every 8 (eight) hours as needed.  Dispense: 30 tablet; Refill: 0 - traMADol (ULTRAM) 50 MG tablet; Take 1 tablet (50 mg total) by mouth every 8 (eight) hours as needed.  Dispense: 30 tablet; Refill: 0 - Ambulatory referral to Hand Surgery   Janan Ridgeishira Sadeel Fiddler PA-C  [facility] 03/28/2014 1:03 PM

## 2014-03-28 NOTE — Patient Instructions (Signed)
Ice elbow for 15-20 minutes 3-4 times daily. Rest elbow as much as possible.

## 2014-06-14 ENCOUNTER — Emergency Department (HOSPITAL_COMMUNITY)
Admission: EM | Admit: 2014-06-14 | Discharge: 2014-06-14 | Disposition: A | Discharge status: Home / Self Care | Payer: BLUE CROSS/BLUE SHIELD | Attending: Emergency Medicine | Admitting: Emergency Medicine

## 2014-06-14 ENCOUNTER — Encounter (HOSPITAL_COMMUNITY): Payer: Self-pay | Admitting: *Deleted

## 2014-06-14 ENCOUNTER — Emergency Department (HOSPITAL_COMMUNITY): Payer: BLUE CROSS/BLUE SHIELD

## 2014-06-14 DIAGNOSIS — R05 Cough: Secondary | ICD-10-CM | POA: Diagnosis present

## 2014-06-14 DIAGNOSIS — Z79899 Other long term (current) drug therapy: Secondary | ICD-10-CM | POA: Insufficient documentation

## 2014-06-14 DIAGNOSIS — Z8719 Personal history of other diseases of the digestive system: Secondary | ICD-10-CM | POA: Insufficient documentation

## 2014-06-14 DIAGNOSIS — J209 Acute bronchitis, unspecified: Secondary | ICD-10-CM | POA: Diagnosis not present

## 2014-06-14 DIAGNOSIS — M791 Myalgia: Secondary | ICD-10-CM | POA: Insufficient documentation

## 2014-06-14 DIAGNOSIS — R197 Diarrhea, unspecified: Secondary | ICD-10-CM | POA: Insufficient documentation

## 2014-06-14 DIAGNOSIS — J40 Bronchitis, not specified as acute or chronic: Secondary | ICD-10-CM

## 2014-06-14 HISTORY — DX: Bronchitis, not specified as acute or chronic: J40

## 2014-06-14 HISTORY — DX: Gastro-esophageal reflux disease without esophagitis: K21.9

## 2014-06-14 MED ORDER — BENZONATATE 100 MG PO CAPS: 100.0000 mg | ORAL_CAPSULE | Freq: Three times a day (TID) | ORAL | Status: DC

## 2014-06-14 MED ORDER — AMOXICILLIN-POT CLAVULANATE 875-125 MG PO TABS: 1.0000 | ORAL_TABLET | Freq: Two times a day (BID) | ORAL | Status: DC

## 2014-06-14 NOTE — ED Notes (Signed)
Patient states he has had a cough productive of white sputum since 5/2.  Patient denies fever, but endorses chills.  Patient denies N/V, but endorses diarrhea.  Patient states he had 2 episodes of diarrhea earlier today.  Patient also c/o nasal congestion, but denies sore throat and ear pain.  Patient is taking Alka-Seltzer night-day to treat his symptoms.  Patient states he has gained some relief from that.  Patient also c/o generalized fatigue, and generalized body aches/pains.  Patient states his children have had colds, but his wife has been fine.

## 2014-06-14 NOTE — ED Provider Notes (Signed)
CSN: [id number]     Arrival date & time 06/14/14  1616 History  This chart was scribed for a non-physician practitioner, Andre Areas, PA-C working with Blane Ohara, MD by [place] Peace, ED Scribe. The patient was seen in [facility]. The patient's care was started at 6:17 PM.    Chief Complaint  Patient presents with  . Cough      Patient is a 44 y.o. male presenting with cough. The history is provided by the patient. No language interpreter was used.  Cough Associated symptoms: chills and myalgias   Associated symptoms: no ear pain and no fever   HPI Comments: Andre Walters is a 44 y.o. male who presents to the Emergency Department complaining of persistent productive cough (sputum: white) onset 05/26/2014 with chills, diarrhea (2 episodes today), and nasal congestion. Pt also complains of generalized body aches and fatigue. No complaints of fever, nausea, or vomiting. He further denies sore throat or ear pain. Pt reports that his children have also been sick but his wife has not. He notes that he has tried taking Alka-Seltzer Night-Day to to treat symptoms with some relief. Allergies to hydrocodone. Pt is non-smoker.   Past Medical History  Diagnosis Date  . Bronchitis   . GERD (gastroesophageal reflux disease)    Past Surgical History  Procedure Laterality Date  . Hernia repair    . Back surgery     Family History  Problem Relation Age of Onset  . Hypertension Mother    History  Substance Use Topics  . Smoking status: Never Smoker   . Smokeless tobacco: Never Used  . Alcohol Use: 1.2 oz/week    2 Cans of beer per week     Comment: once a month    Review of Systems  Constitutional: Positive for chills and fatigue. Negative for fever.  HENT: Positive for congestion. Negative for ear pain.   Respiratory: Positive for cough.   Gastrointestinal: Positive for diarrhea. Negative for nausea and vomiting.  Musculoskeletal: Positive for myalgias.  All other systems reviewed  and are negative.     Allergies  Hydrocodone  Home Medications   Prior to Admission medications   Medication Sig Start Date End Date Taking? Authorizing Provider  acetaminophen (TYLENOL) 325 MG tablet Take 650 mg by mouth every 6 (six) hours as needed.   Yes Historical Provider, MD  cetirizine (ZYRTEC) 10 MG tablet Take 10 mg by mouth daily.   Yes Historical Provider, MD  ibuprofen (ADVIL,MOTRIN) 600 MG tablet Take 1 tablet (600 mg total) by mouth every 8 (eight) hours as needed. 03/28/14  Yes Tishira R Brewington, PA-C  Multiple Vitamin (MULTIVITAMIN WITH MINERALS) TABS tablet Take 1 tablet by mouth daily.   Yes Historical Provider, MD  Menthol-Methyl Salicylate (ICY HOT EXTRA STRENGTH) 10-30 % CREA Apply 1 application topically at bedtime.    Historical Provider, MD  methocarbamol (ROBAXIN) 500 MG tablet Take 1 tablet (500 mg total) by mouth 2 (two) times daily. 08/28/13   Fayrene Helper, PA-C  traMADol (ULTRAM) 50 MG tablet Take 1 tablet (50 mg total) by mouth every 8 (eight) hours as needed. 03/28/14   Tishira R Brewington, PA-C   BP 140/85 mmHg  Pulse 96  Temp(Src) 98.4 F (36.9 C) (Oral)  Resp 16  SpO2 97% Physical Exam  Constitutional: He is oriented to person, place, and time. He appears well-developed and well-nourished. No distress.  HENT:  Head: Normocephalic and atraumatic.  Eyes: Conjunctivae and EOM are normal.  Neck:  Neck supple. No tracheal deviation present.  Cardiovascular: Normal rate.   Pulmonary/Chest: Effort normal. No respiratory distress.  Musculoskeletal: Normal range of motion.  Neurological: He is alert and oriented to person, place, and time.  Skin: Skin is warm and dry.  Psychiatric: He has a normal mood and affect. His behavior is normal.  Nursing note and vitals reviewed.   ED Course  Procedures (including critical care time) Labs Review Labs Reviewed - No data to display  Imaging Review Dg Chest 2 View  06/14/2014   CLINICAL DATA:  Cough,  congestion, fever x3 weeks  EXAM: CHEST  2 VIEW  COMPARISON:  10/22/2010  FINDINGS: Lungs are clear.  No pleural effusion or pneumothorax.  The heart is normal in size.  Visualized osseous structures are within normal limits.  IMPRESSION: Normal chest radiographs.   Electronically Signed   By: Charline BillsSriyesh  Krishnan M.D.   On: 06/14/2014 17:53     EKG Interpretation None     Medications - No data to display  6:20 PM- Treatment plan was discussed with patient who verbalizes understanding and agrees.   MDM   Final diagnoses:  Bronchitis   augmentin Tessalon perles  I personally performed the services in this documentation, which was scribed in my presence.  The recorded information has been reviewed and considered.   Andre PallKaren SofiaPAC. Lonia SkinnerLeslie Walters PacificaSofia, PA-C 06/15/14 [address]1027  Andre Walters WinfieldSofia, PA-C 06/15/14 [address]1027  Andre Walters Walters RinggoldSofia, [place] JerseyPA-C 06/15/14 1028  Eber HongBrian Miller, MD 06/15/14 1515

## 2014-06-14 NOTE — Discharge Instructions (Signed)
Sinusitis °Sinusitis is redness, soreness, and inflammation of the paranasal sinuses. Paranasal sinuses are air pockets within the bones of your face (beneath the eyes, the middle of the forehead, or above the eyes). In healthy paranasal sinuses, mucus is able to drain out, and air is able to circulate through them by way of your nose. However, when your paranasal sinuses are inflamed, mucus and air can become trapped. This can allow bacteria and other germs to grow and cause infection. °Sinusitis can develop quickly and last only a short time (acute) or continue over a long period (chronic). Sinusitis that lasts for more than 12 weeks is considered chronic.  °CAUSES  °Causes of sinusitis include: °· Allergies. °· Structural abnormalities, such as displacement of the cartilage that separates your nostrils (deviated septum), which can decrease the air flow through your nose and sinuses and affect sinus drainage. °· Functional abnormalities, such as when the small hairs (cilia) that line your sinuses and help remove mucus do not work properly or are not present. °SIGNS AND SYMPTOMS  °Symptoms of acute and chronic sinusitis are the same. The primary symptoms are pain and pressure around the affected sinuses. Other symptoms include: °· Upper toothache. °· Earache. °· Headache. °· Bad breath. °· Decreased sense of smell and taste. °· A cough, which worsens when you are lying flat. °· Fatigue. °· Fever. °· Thick drainage from your nose, which often is green and may contain pus (purulent). °· Swelling and warmth over the affected sinuses. °DIAGNOSIS  °Your health care provider will perform a physical exam. During the exam, your health care provider may: °· Look in your nose for signs of abnormal growths in your nostrils (nasal polyps). °· Tap over the affected sinus to check for signs of infection. °· View the inside of your sinuses (endoscopy) using an imaging device that has a light attached (endoscope). °If your health  care provider suspects that you have chronic sinusitis, one or more of the following tests may be recommended: °· Allergy tests. °· Nasal culture. A sample of mucus is taken from your nose, sent to a lab, and screened for bacteria. °· Nasal cytology. A sample of mucus is taken from your nose and examined by your health care provider to determine if your sinusitis is related to an allergy. °TREATMENT  °Most cases of acute sinusitis are related to a viral infection and will resolve on their own within 10 days. Sometimes medicines are prescribed to help relieve symptoms (pain medicine, decongestants, nasal steroid sprays, or saline sprays).  °However, for sinusitis related to a bacterial infection, your health care provider will prescribe antibiotic medicines. These are medicines that will help kill the bacteria causing the infection.  °Rarely, sinusitis is caused by a fungal infection. In theses cases, your health care provider will prescribe antifungal medicine. °For some cases of chronic sinusitis, surgery is needed. Generally, these are cases in which sinusitis recurs more than 3 times per year, despite other treatments. °HOME CARE INSTRUCTIONS  °· Drink plenty of water. Water helps thin the mucus so your sinuses can drain more easily. °· Use a humidifier. °· Inhale steam 3 to 4 times a day (for example, sit in the bathroom with the shower running). °· Apply a warm, moist washcloth to your face 3 to 4 times a day, or as directed by your health care provider. °· Use saline nasal sprays to help moisten and clean your sinuses. °· Take medicines only as directed by your health care provider. °·   If you were prescribed either an antibiotic or antifungal medicine, finish it all even if you start to feel better. °SEEK IMMEDIATE MEDICAL CARE IF: °· You have increasing pain or severe headaches. °· You have nausea, vomiting, or drowsiness. °· You have swelling around your face. °· You have vision problems. °· You have a stiff  neck. °· You have difficulty breathing. °MAKE SURE YOU:  °· Understand these instructions. °· Will watch your condition. °· Will get help right away if you are not doing well or get worse. °Document Released: 01/10/2005 Document Revised: 05/27/2013 Document Reviewed: 01/25/2011 °ExitCare® Patient Information ©2015 [employer]. This information is not intended to replace advice given to you by your health care provider. Make sure you discuss any questions you have with your health care provider. °Acute Bronchitis °Bronchitis is inflammation of the airways that extend from the windpipe into the lungs (bronchi). The inflammation often causes mucus to develop. This leads to a cough, which is the most common symptom of bronchitis.  °In acute bronchitis, the condition usually develops suddenly and goes away over time, usually in a couple weeks. Smoking, allergies, and asthma can make bronchitis worse. Repeated episodes of bronchitis may cause further lung problems.  °CAUSES °Acute bronchitis is most often caused by the same virus that causes a cold. The virus can spread from person to person (contagious) through coughing, sneezing, and touching contaminated objects. °SIGNS AND SYMPTOMS  °· Cough.   °· Fever.   °· Coughing up mucus.   °· Body aches.   °· Chest congestion.   °· Chills.   °· Shortness of breath.   °· Sore throat.   °DIAGNOSIS  °Acute bronchitis is usually diagnosed through a physical exam. Your health care provider will also ask you questions about your medical history. Tests, such as chest X-rays, are sometimes done to rule out other conditions.  °TREATMENT  °Acute bronchitis usually goes away in a couple weeks. Oftentimes, no medical treatment is necessary. Medicines are sometimes given for relief of fever or cough. Antibiotic medicines are usually not needed but may be prescribed in certain situations. In some cases, an inhaler may be recommended to help reduce shortness of breath and control the cough.  A cool mist vaporizer may also be used to help thin bronchial secretions and make it easier to clear the chest.  °HOME CARE INSTRUCTIONS °· Get plenty of rest.   °· Drink enough fluids to keep your urine clear or pale yellow (unless you have a medical condition that requires fluid restriction). Increasing fluids may help thin your respiratory secretions (sputum) and reduce chest congestion, and it will prevent dehydration.   °· Take medicines only as directed by your health care provider. °· If you were prescribed an antibiotic medicine, finish it all even if you start to feel better. °· Avoid smoking and secondhand smoke. Exposure to cigarette smoke or irritating chemicals will make bronchitis worse. If you are a smoker, consider using nicotine gum or skin patches to help control withdrawal symptoms. Quitting smoking will help your lungs heal faster.   °· Reduce the chances of another bout of acute bronchitis by washing your hands frequently, avoiding people with cold symptoms, and trying not to touch your hands to your mouth, nose, or eyes.   °· Keep all follow-up visits as directed by your health care provider.   °SEEK MEDICAL CARE IF: °Your symptoms do not improve after 1 week of treatment.  °SEEK IMMEDIATE MEDICAL CARE IF: °· You develop an increased fever or chills.   °· You have chest pain.   °·   You have severe shortness of breath. °· You have bloody sputum.   °· You develop dehydration. °· You faint or repeatedly feel like you are going to pass out. °· You develop repeated vomiting. °· You develop a severe headache. °MAKE SURE YOU:  °· Understand these instructions. °· Will watch your condition. °· Will get help right away if you are not doing well or get worse. °Document Released: 02/18/2004 Document Revised: 05/27/2013 Document Reviewed: 07/03/2012 °ExitCare® Patient Information ©2015 [employer]. This information is not intended to replace advice given to you by your health care provider. Make sure you  discuss any questions you have with your health care provider. ° °

## 2015-03-29 ENCOUNTER — Emergency Department (HOSPITAL_COMMUNITY)
Admission: EM | Admit: 2015-03-29 | Discharge: 2015-03-29 | Disposition: A | Discharge status: Home / Self Care | Payer: BLUE CROSS/BLUE SHIELD | Attending: Emergency Medicine | Admitting: Emergency Medicine

## 2015-03-29 ENCOUNTER — Emergency Department (HOSPITAL_COMMUNITY): Payer: BLUE CROSS/BLUE SHIELD

## 2015-03-29 ENCOUNTER — Encounter (HOSPITAL_COMMUNITY): Payer: Self-pay | Admitting: *Deleted

## 2015-03-29 DIAGNOSIS — Z8709 Personal history of other diseases of the respiratory system: Secondary | ICD-10-CM | POA: Diagnosis not present

## 2015-03-29 DIAGNOSIS — Z8719 Personal history of other diseases of the digestive system: Secondary | ICD-10-CM | POA: Diagnosis not present

## 2015-03-29 DIAGNOSIS — Z79899 Other long term (current) drug therapy: Secondary | ICD-10-CM | POA: Diagnosis not present

## 2015-03-29 DIAGNOSIS — R42 Dizziness and giddiness: Secondary | ICD-10-CM | POA: Diagnosis present

## 2015-03-29 DIAGNOSIS — R202 Paresthesia of skin: Secondary | ICD-10-CM | POA: Diagnosis not present

## 2015-03-29 DIAGNOSIS — I10 Essential (primary) hypertension: Secondary | ICD-10-CM

## 2015-03-29 HISTORY — DX: Essential (primary) hypertension: I10

## 2015-03-29 LAB — BASIC METABOLIC PANEL
Anion gap: 8 (ref 5–15)
BUN: 15 mg/dL (ref 6–20)
CHLORIDE: 105 mmol/L (ref 101–111)
CO2: 26 mmol/L (ref 22–32)
CREATININE: 0.93 mg/dL (ref 0.61–1.24)
Calcium: 9 mg/dL (ref 8.9–10.3)
GFR calc non Af Amer: >60 mL/min (ref >60)
GLUCOSE: 91 mg/dL (ref 65–99)
Potassium: 4.1 mmol/L (ref 3.5–5.1)
Sodium: 139 mmol/L (ref 135–145)

## 2015-03-29 LAB — CBC
HCT: 42.1 % (ref 39.0–52.0)
HEMOGLOBIN: 13.4 g/dL (ref 13.0–17.0)
MCH: 29.9 pg (ref 26.0–34.0)
MCHC: 31.8 g/dL (ref 30.0–36.0)
MCV: 94 fL (ref 78.0–100.0)
Platelets: 277 10*3/uL (ref 150–400)
RBC: 4.48 MIL/uL (ref 4.22–5.81)
RDW: 11.7 % (ref 11.5–15.5)
WBC: 4.1 10*3/uL (ref 4.0–10.5)

## 2015-03-29 LAB — URINALYSIS, ROUTINE W REFLEX MICROSCOPIC
BILIRUBIN URINE: NEGATIVE
GLUCOSE, UA: NEGATIVE mg/dL
HGB URINE DIPSTICK: NEGATIVE
Ketones, ur: NEGATIVE mg/dL
Leukocytes, UA: NEGATIVE
Nitrite: NEGATIVE
Protein, ur: NEGATIVE mg/dL
SPECIFIC GRAVITY, URINE: 1.02 (ref 1.005–1.030)
pH: 7.5 (ref 5.0–8.0)

## 2015-03-29 MED ORDER — CLONIDINE HCL 0.1 MG PO TABS
0.2000 mg | ORAL_TABLET | Freq: Once | ORAL | Status: AC
  Administered 2015-03-29: 0.2 mg via ORAL
  Filled 2015-03-29: qty 2

## 2015-03-29 NOTE — ED Provider Notes (Signed)
CSN: [id number]     Arrival date & time 03/29/15  1030 History   First MD Initiated Contact with Patient 03/29/15 1209     Chief Complaint  Patient presents with  . Visual Field Change  . Tingling     HPI Patient presents for evaluation of high blood pressure and a tingly arm. Patient states he was seen by his primary care physician naproxen one month ago and placed on an ARB.  He took a dose or 2. He then states that he "wanted to change my diet and exercise and lose weight rather than take medicine". He states his blood pressures have been run some days of high on other days. He waking this morning. He states his vision seemed blurry he was dizzy and lightheadedness left arm was tingling. These have all resolved. Her blood pressure was high and he became concerned. He presents here. He is a nonsmoker. No illicit drug use. No other significant past medical history  Past Medical History  Diagnosis Date  . Bronchitis   . GERD (gastroesophageal reflux disease)   . Hypertension    Past Surgical History  Procedure Laterality Date  . Hernia repair    . Back surgery     Family History  Problem Relation Age of Onset  . Hypertension Mother    Social History  Substance Use Topics  . Smoking status: Never Smoker   . Smokeless tobacco: Never Used  . Alcohol Use: 1.2 oz/week    2 Cans of beer per week     Comment: once a month    Review of Systems  Constitutional: Negative for fever, chills, diaphoresis, appetite change and fatigue.  HENT: Negative for mouth sores, sore throat and trouble swallowing.   Eyes: Negative for visual disturbance.  Respiratory: Negative for cough, chest tightness, shortness of breath and wheezing.   Cardiovascular: Negative for chest pain.  Gastrointestinal: Negative for nausea, vomiting, abdominal pain, diarrhea and abdominal distention.  Endocrine: Negative for polydipsia, polyphagia and polyuria.  Genitourinary: Negative for dysuria, frequency and  hematuria.  Musculoskeletal: Negative for gait problem.  Skin: Negative for color change, pallor and rash.  Neurological: Positive for dizziness. Negative for syncope, light-headedness and headaches.       Transient left arm tingling.  Hematological: Does not bruise/bleed easily.  Psychiatric/Behavioral: Negative for behavioral problems and confusion.      Allergies  Hydrocodone  Home Medications   Prior to Admission medications   Medication Sig Start Date End Date Taking? Authorizing Provider  Azilsartan Medoxomil (EDARBI) 40 MG TABS Take 40 mg by mouth daily.   Yes Historical Provider, MD  amoxicillin-clavulanate (AUGMENTIN) 875-125 MG per tablet Take 1 tablet by mouth every 12 (twelve) hours. Patient not taking: Reported on 03/29/2015 06/14/14   Elson AreasLeslie K Sofia, PA-C  benzonatate (TESSALON) 100 MG capsule Take 1 capsule (100 mg total) by mouth every 8 (eight) hours. Patient not taking: Reported on 03/29/2015 06/14/14   Elson AreasLeslie K Sofia, PA-C  methocarbamol (ROBAXIN) 500 MG tablet Take 1 tablet (500 mg total) by mouth 2 (two) times daily. Patient not taking: Reported on 03/29/2015 08/28/13   Fayrene HelperBowie Tran, PA-C  traMADol (ULTRAM) 50 MG tablet Take 1 tablet (50 mg total) by mouth every 8 (eight) hours as needed. Patient not taking: Reported on 03/29/2015 03/28/14   Tishira R Brewington, PA-C   BP 122/83 mmHg  Pulse 85  Temp(Src) 98.5 F (36.9 C) (Oral)  Resp 18  SpO2 99% Physical Exam  Constitutional: He is  oriented to person, place, and time. He appears well-developed and well-nourished. No distress.  HENT:  Head: Normocephalic.  Eyes: Conjunctivae are normal. Pupils are equal, round, and reactive to light. No scleral icterus.  Neck: Normal range of motion. Neck supple. No thyromegaly present.  Cardiovascular: Normal rate and regular rhythm.  Exam reveals no gallop and no friction rub.   No murmur heard. Pulmonary/Chest: Effort normal and breath sounds normal. No respiratory distress. He has  no wheezes. He has no rales.  Abdominal: Soft. Bowel sounds are normal. He exhibits no distension. There is no tenderness. There is no rebound.  Musculoskeletal: Normal range of motion.  Neurological: He is alert and oriented to person, place, and time.  Skin: Skin is warm and dry. No rash noted.  Psychiatric: He has a normal mood and affect. His behavior is normal.    ED Course  Procedures (including critical care time) Labs Review Labs Reviewed  BASIC METABOLIC PANEL  CBC  URINALYSIS, ROUTINE W REFLEX MICROSCOPIC (NOT AT [facility])  CBG MONITORING, ED    Imaging Review Mr Brain Wo Contrast  03/29/2015  CLINICAL DATA:  Acute presentation with hypertension, blurred vision, dizziness and left arm tingling. EXAM: MRI HEAD WITHOUT CONTRAST TECHNIQUE: Multiplanar, multiecho pulse sequences of the brain and surrounding structures were obtained without intravenous contrast. COMPARISON:  Head CT 08/09/2011 FINDINGS: Diffusion imaging does not show any acute or subacute infarction. The brainstem and cerebellum are normal. There are a few small foci of T2 and FLAIR signal in the hemispheric deep white matter which could represent old small vessel infarctions. No cortical or large vessel territory infarction. No mass lesion, hemorrhage, hydrocephalus or extra-axial collection. No pituitary mass. There are inflammatory changes of the maxillary sinuses and the ethmoid sinuses. Major vessels at the base of the brain show flow. IMPRESSION: No acute intracranial finding. 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 Inflammatory changes at the maxillary and ethmoid sinuses. Electronically Signed   By: Paulina Fusi M.D.   On: 03/29/2015 14:24   I have personally reviewed and evaluated these images and lab results as part of my medical decision-making.   EKG Interpretation   Date/Time:  Sunday March 29 2015 11:36:01 EST Ventricular Rate:  96 PR Interval:  146 QRS Duration:  92 QT Interval:  349 QTC Calculation: 441 R Axis:   58 Text Interpretation:  Sinus rhythm No significant change since last  tracing Confirmed by KNAPP  MD-J, JON ([id number]) on 03/29/2015 11:46:23 AM      MDM   Final diagnoses:  Essential hypertension    Blood pressure. After single dose limiting. Normal MRI. EKG without acute changes. Normal renal function. Plan is discharge home. We'll resume/has had hypertensive at home. Primary care follow-up.    Rolland Porter, MD 03/29/15 1550

## 2015-03-29 NOTE — Discharge Instructions (Signed)
Hypertension Hypertension, commonly called high blood pressure, is when the force of blood pumping through your arteries is too strong. Your arteries are the blood vessels that carry blood from your heart throughout your body. A blood pressure reading consists of a higher number over a lower number, such as 110/72. The higher number (systolic) is the pressure inside your arteries when your heart pumps. The lower number (diastolic) is the pressure inside your arteries when your heart relaxes. Ideally you want your blood pressure below 120/80. Hypertension forces your heart to work harder to pump blood. Your arteries may become narrow or stiff. Having untreated or uncontrolled hypertension can cause heart attack, stroke, kidney disease, and other problems. RISK FACTORS Some risk factors for high blood pressure are controllable. Others are not.  Risk factors you cannot control include:   Race. You may be at higher risk if you are African American.  Age. Risk increases with age.  Gender. Men are at higher risk than women before age 45 years. After age 65, women are at higher risk than men. Risk factors you can control include:  Not getting enough exercise or physical activity.  Being overweight.  Getting too much fat, sugar, calories, or salt in your diet.  Drinking too much alcohol. SIGNS AND SYMPTOMS Hypertension does not usually cause signs or symptoms. Extremely high blood pressure (hypertensive crisis) may cause headache, anxiety, shortness of breath, and nosebleed. DIAGNOSIS To check if you have hypertension, your health care provider will measure your blood pressure while you are seated, with your arm held at the level of your heart. It should be measured at least twice using the same arm. Certain conditions can cause a difference in blood pressure between your right and left arms. A blood pressure reading that is higher than normal on one occasion does not mean that you need treatment. If  it is not clear whether you have high blood pressure, you may be asked to return on a different day to have your blood pressure checked again. Or, you may be asked to monitor your blood pressure at home for 1 or more weeks. TREATMENT Treating high blood pressure includes making lifestyle changes and possibly taking medicine. Living a healthy lifestyle can help lower high blood pressure. You may need to change some of your habits. Lifestyle changes may include:  Following the DASH diet. This diet is high in fruits, vegetables, and whole grains. It is low in salt, red meat, and added sugars.  Keep your sodium intake below 2,300 mg per day.  Getting at least 30-45 minutes of aerobic exercise at least 4 times per week.  Losing weight if necessary.  Not smoking.  Limiting alcoholic beverages.  Learning ways to reduce stress. Your health care provider may prescribe medicine if lifestyle changes are not enough to get your blood pressure under control, and if one of the following is true:  You are 18-59 years of age and your systolic blood pressure is above 140.  You are 60 years of age or older, and your systolic blood pressure is above 150.  Your diastolic blood pressure is above 90.  You have diabetes, and your systolic blood pressure is over 140 or your diastolic blood pressure is over 90.  You have kidney disease and your blood pressure is above 140/90.  You have heart disease and your blood pressure is above 140/90. Your personal target blood pressure may vary depending on your medical conditions, your age, and other factors. HOME CARE INSTRUCTIONS    Have your blood pressure rechecked as directed by your health care provider.   Take medicines only as directed by your health care provider. Follow the directions carefully. Blood pressure medicines must be taken as prescribed. The medicine does not work as well when you skip doses. Skipping doses also puts you at risk for  problems.  Do not smoke.   Monitor your blood pressure at home as directed by your health care provider. SEEK MEDICAL CARE IF:   You think you are having a reaction to medicines taken.  You have recurrent headaches or feel dizzy.  You have swelling in your ankles.  You have trouble with your vision. SEEK IMMEDIATE MEDICAL CARE IF:  You develop a severe headache or confusion.  You have unusual weakness, numbness, or feel faint.  You have severe chest or abdominal pain.  You vomit repeatedly.  You have trouble breathing. MAKE SURE YOU:   Understand these instructions.  Will watch your condition.  Will get help right away if you are not doing well or get worse.   This information is not intended to replace advice given to you by your health care provider. Make sure you discuss any questions you have with your health care provider.   Document Released: 01/10/2005 Document Revised: 05/27/2014 Document Reviewed: 11/02/2012 Elsevier Interactive Patient Education 2016 [employer]  

## 2015-03-29 NOTE — ED Notes (Signed)
Pt returned from MRI °

## 2015-03-29 NOTE — ED Notes (Signed)
Pt reports checking his BP this morning and it was elevated at 143/103.  Pt reports blurred vision, dizzy and L arm tingling.  Pt reports he was dx with HTN ~10 days ago and only took his BP med once.  He states he is trying to eat healthy instead of taking it.  Pt denies any SOB or cp at this time.

## 2015-03-29 NOTE — ED Notes (Signed)
Patient transported to MRI 

## 2015-06-11 ENCOUNTER — Emergency Department (HOSPITAL_COMMUNITY): Payer: BLUE CROSS/BLUE SHIELD

## 2015-06-11 ENCOUNTER — Emergency Department (HOSPITAL_COMMUNITY)
Admission: EM | Admit: 2015-06-11 | Discharge: 2015-06-11 | Disposition: A | Discharge status: Home / Self Care | Payer: BLUE CROSS/BLUE SHIELD | Attending: Emergency Medicine | Admitting: Emergency Medicine

## 2015-06-11 ENCOUNTER — Encounter (HOSPITAL_COMMUNITY): Payer: Self-pay

## 2015-06-11 DIAGNOSIS — Z79899 Other long term (current) drug therapy: Secondary | ICD-10-CM | POA: Insufficient documentation

## 2015-06-11 DIAGNOSIS — Y939 Activity, unspecified: Secondary | ICD-10-CM | POA: Insufficient documentation

## 2015-06-11 DIAGNOSIS — Y990 Civilian activity done for income or pay: Secondary | ICD-10-CM | POA: Insufficient documentation

## 2015-06-11 DIAGNOSIS — Y929 Unspecified place or not applicable: Secondary | ICD-10-CM | POA: Diagnosis not present

## 2015-06-11 DIAGNOSIS — I10 Essential (primary) hypertension: Secondary | ICD-10-CM | POA: Insufficient documentation

## 2015-06-11 DIAGNOSIS — X500XXA Overexertion from strenuous movement or load, initial encounter: Secondary | ICD-10-CM | POA: Insufficient documentation

## 2015-06-11 DIAGNOSIS — M545 Low back pain, unspecified: Secondary | ICD-10-CM

## 2015-06-11 DIAGNOSIS — M549 Dorsalgia, unspecified: Secondary | ICD-10-CM | POA: Diagnosis present

## 2015-06-11 LAB — CBC WITH DIFFERENTIAL/PLATELET
BASOS PCT: 0 %
Basophils Absolute: 0 10*3/uL (ref 0.0–0.1)
Eosinophils Absolute: 0 10*3/uL (ref 0.0–0.7)
Eosinophils Relative: 0 %
HCT: 42.2 % (ref 39.0–52.0)
Hemoglobin: 14.3 g/dL (ref 13.0–17.0)
LYMPHS PCT: 28 %
Lymphs Abs: 1.6 10*3/uL (ref 0.7–4.0)
MCH: 30.9 pg (ref 26.0–34.0)
MCHC: 33.9 g/dL (ref 30.0–36.0)
MCV: 91.1 fL (ref 78.0–100.0)
MONO ABS: 0.5 10*3/uL (ref 0.1–1.0)
MONOS PCT: 8 %
NEUTROS ABS: 3.7 10*3/uL (ref 1.7–7.7)
NEUTROS PCT: 64 %
PLATELETS: 249 10*3/uL (ref 150–400)
RBC: 4.63 MIL/uL (ref 4.22–5.81)
RDW: 12 % (ref 11.5–15.5)
WBC: 5.8 10*3/uL (ref 4.0–10.5)

## 2015-06-11 LAB — BASIC METABOLIC PANEL
Anion gap: 9 (ref 5–15)
BUN: 12 mg/dL (ref 6–20)
CALCIUM: 9.5 mg/dL (ref 8.9–10.3)
CO2: 25 mmol/L (ref 22–32)
Chloride: 104 mmol/L (ref 101–111)
Creatinine, Ser: 0.88 mg/dL (ref 0.61–1.24)
GFR calc Af Amer: >60 mL/min (ref >60)
GLUCOSE: 97 mg/dL (ref 65–99)
Potassium: 3.9 mmol/L (ref 3.5–5.1)
Sodium: 138 mmol/L (ref 135–145)

## 2015-06-11 MED ORDER — IBUPROFEN 200 MG PO TABS: 800.0000 mg | ORAL_TABLET | Freq: Three times a day (TID) | ORAL | Status: AC

## 2015-06-11 MED ORDER — DEXAMETHASONE SODIUM PHOSPHATE 10 MG/ML IJ SOLN
10.0000 mg | Freq: Once | INTRAMUSCULAR | Status: AC
  Administered 2015-06-11: 10 mg via INTRAMUSCULAR
  Filled 2015-06-11: qty 1

## 2015-06-11 MED ORDER — KETOROLAC TROMETHAMINE 30 MG/ML IJ SOLN
30.0000 mg | Freq: Once | INTRAMUSCULAR | Status: AC
  Administered 2015-06-11: 30 mg via INTRAMUSCULAR
  Filled 2015-06-11: qty 1

## 2015-06-11 MED ORDER — PREDNISONE 20 MG PO TABS: 40.0000 mg | ORAL_TABLET | Freq: Every day | ORAL | Status: DC

## 2015-06-11 NOTE — ED Notes (Signed)
Patient transported to X-ray 

## 2015-06-11 NOTE — ED Provider Notes (Signed)
CSN: [id number]     Arrival date & time 06/11/15  0708 History   First MD Initiated Contact with Patient 06/11/15 [phone]     Chief Complaint  Patient presents with  . Back Pain     (Consider location/radiation/quality/duration/timing/severity/associated sxs/prior Treatment) HPI Patient presents with concern of back pain. Patient history of 2 prior MR spine procedures, last 9 years ago. 2 days ago, after moving heavy objects at work he felt gradual onset of pain diffusely throughout the back, and now the abdomen. Pain is severe, substantial cannot improve with tramadol, gabapentin. No loss of sensation distally, no incontinence, new urinary changes, no fever.  Past Medical History  Diagnosis Date  . Bronchitis   . GERD (gastroesophageal reflux disease)   . Hypertension    Past Surgical History  Procedure Laterality Date  . Hernia repair    . Back surgery     Family History  Problem Relation Age of Onset  . Hypertension Mother    Social History  Substance Use Topics  . Smoking status: Never Smoker   . Smokeless tobacco: Never Used  . Alcohol Use: No     Comment: once a month    Review of Systems  Constitutional:       Per HPI, otherwise negative  HENT:       Per HPI, otherwise negative  Respiratory:       Per HPI, otherwise negative  Cardiovascular:       Per HPI, otherwise negative  Gastrointestinal: Negative for vomiting.  Endocrine:       Negative aside from HPI  Genitourinary:       Neg aside from HPI   Musculoskeletal:       Per HPI, otherwise negative  Skin: Negative.   Neurological: Negative for syncope.      Allergies  Edarbi and Hydrocodone  Home Medications   Prior to Admission medications   Medication Sig Start Date End Date Taking? Authorizing Provider  cetirizine (ZYRTEC) 10 MG tablet Take 10 mg by mouth daily as needed for allergies.   Yes Historical Provider, MD  gabapentin (NEURONTIN) 800 MG tablet Take 800 mg by mouth daily as needed  (for pain).   Yes Historical Provider, MD  ibuprofen (ADVIL,MOTRIN) 200 MG tablet Take 800 mg by mouth every 6 (six) hours as needed for fever, headache, mild pain, moderate pain or cramping.   Yes Historical Provider, MD  traMADol (ULTRAM) 50 MG tablet Take 1 tablet (50 mg total) by mouth every 8 (eight) hours as needed. Patient taking differently: Take 50 mg by mouth every 6 (six) hours as needed for moderate pain.  03/28/14  Yes Tishira R Brewington, PA-C   BP 145/94 mmHg  Pulse 107  Temp(Src) 98.4 F (36.9 C) (Oral)  Resp 14  Ht 6' (1.829 m)  Wt 177 lb (80.287 kg)  BMI 24.00 kg/m2  SpO2 100% Physical Exam  Constitutional: He is oriented to person, place, and time. He appears well-developed. No distress.  HENT:  Head: Normocephalic and atraumatic.  Eyes: Conjunctivae and EOM are normal.  Cardiovascular: Normal rate and regular rhythm.   Pulmonary/Chest: Effort normal. No stridor. No respiratory distress.  Abdominal: He exhibits no distension.    Musculoskeletal: He exhibits no edema.       Arms:      Legs: Neurological: He is alert and oriented to person, place, and time.  Skin: Skin is warm and dry.  Psychiatric: He has a normal mood and affect.  Nursing note  and vitals reviewed.   ED Course  Procedures (including critical care time) Labs Review Labs Reviewed  CBC WITH DIFFERENTIAL/PLATELET  BASIC METABOLIC PANEL    Imaging Review Dg Lumbar Spine Complete  06/11/2015  CLINICAL DATA:  Back surgery in 2004 and 2006 EXAM: LUMBAR SPINE - COMPLETE 4+ VIEW COMPARISON:  None. FINDINGS: Posterior lumbar fusion at L4-L5 with pedicle screws and fusion rods. Interbody spacer present. Normal alignment of the lumbar vertebral bodies.  No subluxation. IMPRESSION: Posterior lumbar fusion without complication. Electronically Signed   By: Genevive BiStewart  Edmunds M.D.   On: 06/11/2015 09:09   I have personally reviewed and evaluated these images and lab results as part of my medical  decision-making.   Update: Patient awake and alert, aware of all findings. Patient will start a steroid, anti-inflammatory therapy, follow up with his neurosurgeon. MDM  Patient with a history of prior lumbar spine surgery now presents with severe low back pain. Here, no evidence for neurologic compromise. X-ray does not chemistry changes in the hardware, labs are reassuring. Patient discharged in stable condition after he improved clinically there   Gerhard Munchobert Ellakate Gonsalves, MD 06/11/15 1050

## 2015-06-11 NOTE — ED Notes (Signed)
Pt has hx of back surgery x 2, last in 2006.  He lifts/pushes heavy materials at work and believes it has caused his pain today.  Rates pain "past 10" and states he can't get relief in any position.  States the pain is similar to when he had to have his back surgery.  Denies dysuria.  Also c/o abd pain with nausea yesterday. LBM yesterday & normal.

## 2015-06-11 NOTE — Discharge Instructions (Signed)
As discussed, your evaluation today has been largely reassuring.  But, it is important that you monitor your condition carefully, and do not hesitate to return to the ED if you develop new, or concerning changes in your condition. ? ?Otherwise, please follow-up with your physician for appropriate ongoing care. ? ?

## 2015-07-02 ENCOUNTER — Other Ambulatory Visit: Payer: Self-pay | Admitting: Internal Medicine

## 2015-07-02 DIAGNOSIS — G8929 Other chronic pain: Secondary | ICD-10-CM

## 2015-07-02 DIAGNOSIS — M545 Low back pain, unspecified: Secondary | ICD-10-CM

## 2015-07-07 ENCOUNTER — Ambulatory Visit
Admission: RE | Admit: 2015-07-07 | Discharge: 2015-07-07 | Disposition: A | Discharge status: Home / Self Care | Payer: BLUE CROSS/BLUE SHIELD | Source: Ambulatory Visit | Attending: Internal Medicine | Admitting: Internal Medicine

## 2015-07-07 DIAGNOSIS — M545 Low back pain, unspecified: Secondary | ICD-10-CM

## 2015-07-07 DIAGNOSIS — G8929 Other chronic pain: Secondary | ICD-10-CM

## 2016-09-02 ENCOUNTER — Emergency Department (HOSPITAL_COMMUNITY): Payer: BLUE CROSS/BLUE SHIELD

## 2016-09-02 ENCOUNTER — Emergency Department (HOSPITAL_COMMUNITY)
Admission: EM | Admit: 2016-09-02 | Discharge: 2016-09-02 | Disposition: A | Discharge status: Home / Self Care | Payer: BLUE CROSS/BLUE SHIELD | Attending: Emergency Medicine | Admitting: Emergency Medicine

## 2016-09-02 ENCOUNTER — Encounter (HOSPITAL_COMMUNITY): Payer: Self-pay | Admitting: Emergency Medicine

## 2016-09-02 DIAGNOSIS — Z79899 Other long term (current) drug therapy: Secondary | ICD-10-CM | POA: Diagnosis not present

## 2016-09-02 DIAGNOSIS — R0789 Other chest pain: Secondary | ICD-10-CM | POA: Diagnosis not present

## 2016-09-02 DIAGNOSIS — I10 Essential (primary) hypertension: Secondary | ICD-10-CM | POA: Diagnosis not present

## 2016-09-02 LAB — BASIC METABOLIC PANEL
ANION GAP: 8 (ref 5–15)
BUN: 17 mg/dL (ref 6–20)
CALCIUM: 9.1 mg/dL (ref 8.9–10.3)
CO2: 23 mmol/L (ref 22–32)
Chloride: 107 mmol/L (ref 101–111)
Creatinine, Ser: 0.96 mg/dL (ref 0.61–1.24)
GLUCOSE: 106 mg/dL — AB (ref 65–99)
POTASSIUM: 3.9 mmol/L (ref 3.5–5.1)
Sodium: 138 mmol/L (ref 135–145)

## 2016-09-02 LAB — CBC
HEMATOCRIT: 40.5 % (ref 39.0–52.0)
HEMOGLOBIN: 13.8 g/dL (ref 13.0–17.0)
MCH: 31.3 pg (ref 26.0–34.0)
MCHC: 34.1 g/dL (ref 30.0–36.0)
MCV: 91.8 fL (ref 78.0–100.0)
Platelets: 255 10*3/uL (ref 150–400)
RBC: 4.41 MIL/uL (ref 4.22–5.81)
RDW: 11.8 % (ref 11.5–15.5)
WBC: 3.5 10*3/uL — AB (ref 4.0–10.5)

## 2016-09-02 LAB — I-STAT TROPONIN, ED: TROPONIN I, POC: 0 ng/mL (ref 0.00–0.08)

## 2016-09-02 MED ORDER — METHOCARBAMOL 500 MG PO TABS: 1000.0000 mg | ORAL_TABLET | Freq: Three times a day (TID) | ORAL | 0 refills | Status: AC | PRN

## 2016-09-02 NOTE — ED Triage Notes (Signed)
L side chest pain intermittently over the past week with L arm tingling.

## 2016-09-02 NOTE — Discharge Instructions (Signed)
At this time, I most suspect your chest pain is due to chest wall strain. Take Robaxin as prescribed for pain.  Do not stop taking your blood pressure medication. It is very important that you have your blood pressure regularly monitored and managed by a family doctor.  See a family doctor for recheck within 3-5 days. Return to the emergency department if you're getting new, worsening or changing symptoms.  You will need a repeat chest x-ray in 4-6 weeks.

## 2016-09-02 NOTE — ED Provider Notes (Signed)
[facility]-EMERGENCY DEPT Provider Note   CSN: [id number] Arrival date & time: 09/02/16  0800     History   Chief Complaint Chief Complaint  Patient presents with  . Chest Pain    HPI Andre Walters is a 46 y.o. male.  HPI Patient reports he's had chest pain since Monday. (Approximately 5 days). Pain has been on the left side of his chest and a frequent, more or less constant squeezing aching pain that comes in bursts. He denies associated shortness of breath. He does report he has had some cough for the past week. Occasionally productive of sputum with no hemoptysis. He does report he has been sweating despite keeping his air conditioning and fan on but no documented fever. No nausea vomiting abdominal pain or diarrhea. No lower extremity swelling or calf pain. Patient has continued to go to work. He reports pain is actually most notable when he is at rest as in sitting watching television. He reports he did go to the gym on Tuesday and do curls, DIPs and bench press without any pain. Patient has past medical history of hypertension but reports he has not been taking his hydrochlorothiazide because he always would like to wean off of it. He has been monitoring his blood pressure and noting that it is been running high. He cannot recall the exact numbers. He reports he did take his hydrochlorothiazide this morning because he got worried. Past Medical History:  Diagnosis Date  . Bronchitis   . GERD (gastroesophageal reflux disease)   . Hypertension     There are no active problems to display for this patient.   Past Surgical History:  Procedure Laterality Date  . BACK SURGERY    . HERNIA REPAIR         Home Medications    Prior to Admission medications   Medication Sig Start Date End Date Taking? Authorizing Provider  hydrochlorothiazide (HYDRODIURIL) 25 MG tablet Take 25 mg by mouth daily.   Yes [provider]  gabapentin (NEURONTIN) 800 MG tablet Take 800 mg by mouth  daily as needed (for pain).    [provider]  methocarbamol (ROBAXIN) 500 MG tablet Take 2 tablets (1,000 mg total) by mouth every 8 (eight) hours as needed for muscle spasms. 09/02/16   Arby Barrette, MD  predniSONE (DELTASONE) 20 MG tablet Take 2 tablets (40 mg total) by mouth daily with breakfast. For the next four days Patient not taking: Reported on 09/02/2016 06/11/15   Gerhard Munch, MD    Family History Family History  Problem Relation Age of Onset  . Hypertension Mother     Social History Social History  Substance Use Topics  . Smoking status: Never Smoker  . Smokeless tobacco: Never Used  . Alcohol use No     Comment: once a month     Allergies   Edarbi [azilsartan] and Hydrocodone   Review of Systems Review of Systems 10 Systems reviewed and are negative for acute change except as noted in the HPI.   Physical Exam Updated Vital Signs BP (!) 146/98   Pulse 87   Temp 98.4 F (36.9 C)   Resp 19   SpO2 97%   Physical Exam  Constitutional: He appears well-developed and well-nourished.  HENT:  Head: Normocephalic and atraumatic.  Eyes: Conjunctivae and EOM are normal.  Neck: Neck supple.  Cardiovascular: Normal rate, regular rhythm, normal heart sounds and intact distal pulses.   No murmur heard. Pulmonary/Chest: Effort normal and breath sounds  normal. No respiratory distress. He exhibits tenderness.  Patient does have localizing chest wall tenderness on the left at the costosternal margins from about the second to the fifth ribs. He notes this does feel perceptively painful relative to the right.  Abdominal: Soft. He exhibits no distension. There is no tenderness. There is no guarding.  Musculoskeletal: Normal range of motion. He exhibits no edema or tenderness.  Lower extremities nontender to palpation. No popliteal fossa tenderness.  Neurological: He is alert.  Skin: Skin is warm and dry.  Psychiatric: He has a normal mood and affect.    Nursing note and vitals reviewed.    ED Treatments / Results  Labs (all labs ordered are listed, but only abnormal results are displayed) Labs Reviewed  BASIC METABOLIC PANEL - Abnormal; Notable for the following:       Result Value   Glucose, Bld 106 (*)    All other components within normal limits  CBC - Abnormal; Notable for the following:    WBC 3.5 (*)    All other components within normal limits  I-STAT TROPONIN, ED    EKG  EKG Interpretation  Date/Time:  Friday September 02 2016 08:08:29 EDT Ventricular Rate:  83 PR Interval:    QRS Duration: 91 QT Interval:  363 QTC Calculation: 427 R Axis:   70 Text Interpretation:  Sinus rhythm ST elev, probable normal early repol pattern Confirmed by Arby BarrettePfeiffer, Cashe Gatt [phone]([id number]) on 09/02/2016 8:29:45 AM       Radiology Dg Chest 2 View  Result Date: 09/02/2016 CLINICAL DATA:  Left chest pain EXAM: CHEST  2 VIEW COMPARISON:  06/14/2014 FINDINGS: Normal heart size and mediastinal contours. Perihilar density in the lateral projection favors summation shadows; the hila have a stable normal appearance frontally. No acute infiltrate or edema. No effusion or pneumothorax. No acute osseous findings. IMPRESSION: 1. No acute finding. 2. Nodular density overlapping the hila in the lateral projection is likely summation shadows. After convalescence, recommend two-view follow-up to ensure this is not a persistent finding. Electronically Signed   By: Marnee SpringJonathon  Watts M.D.   On: 09/02/2016 08:59    Procedures Procedures (including critical care time)  Medications Ordered in ED Medications - No data to display   Initial Impression / Assessment and Plan / ED Course  I have reviewed the triage vital signs and the nursing notes.  Pertinent labs & imaging results that were available during my care of the patient were reviewed by me and considered in my medical decision making (see chart for details).      Final Clinical Impressions(s) / ED  Diagnoses   Final diagnoses:  Chest wall pain   At this time, I most suspect chest wall pain as the etiology for the patient's symptoms. It is spasmodic in quality and most noticeable while at rest. Patient is able to exert and not have chest pain. Pain is also reproducible in the area of the patient's perceived pain. Patient has been noncompliant with his antihypertensive medications however he is not severely hypertensive. He is nonetheless counseled on the importance of routine management of hypertension under the care of his physician. Patient is counseled on signs and symptoms first return. He is a made aware of x-ray finding of suspected artifact and need to follow-up with repeat imaging. At this time, I do not suspect pneumonia although patient endorses some cough. His lungs are completely clear, he does not have leukocytosis nor findings on chest x-ray to suggest pneumonia. New Prescriptions New Prescriptions  METHOCARBAMOL (ROBAXIN) 500 MG TABLET    Take 2 tablets (1,000 mg total) by mouth every 8 (eight) hours as needed for muscle spasms.     Arby Barrette, MD 09/02/16 [phone]

## 2017-04-21 IMAGING — MR MR HEAD W/O CM
8 of 10 series · 39 of 48 positions shown · non-contrast
Comparison: Head CT 08/09/2011

CLINICAL DATA: Acute presentation with hypertension, blurred
vision, dizziness and left arm tingling.

EXAM:
MRI HEAD WITHOUT CONTRAST
TECHNIQUE: Multiplanar, multiecho pulse sequences of the brain and surrounding
structures were obtained without intravenous contrast.

[Series 3: T1 · sagittal · 5.0mm · 0.47mm/px · 2 of 24 slices shown]
[im 1/24]
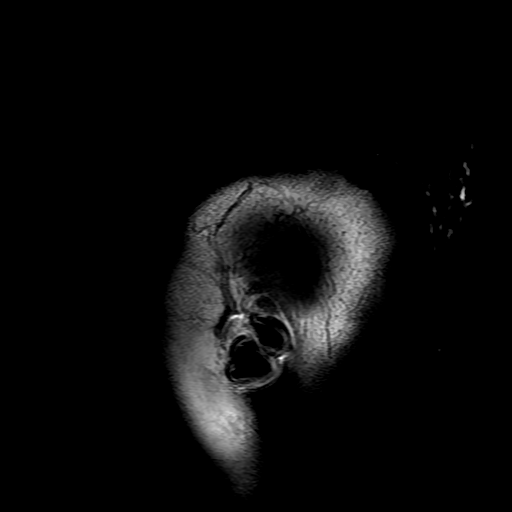
[im 24/24]
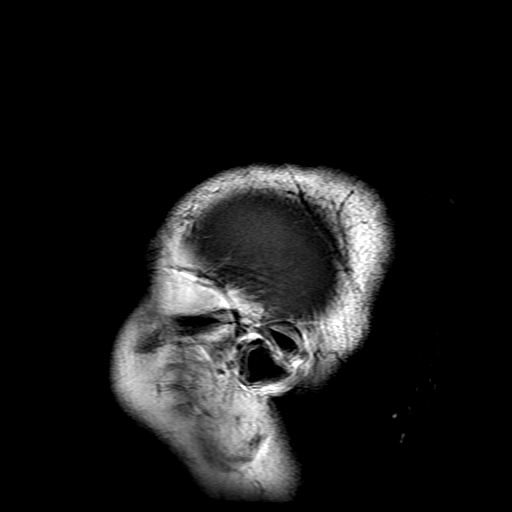

[Series 6: DWI · axial · 3.0mm · 1.09mm/px · z∈[-88,+52]mm · 11 of 102 slices shown (1 of 4)]
[im 1/102]
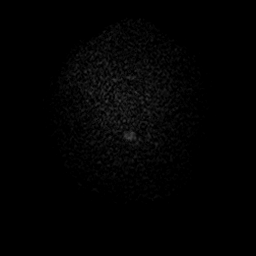
[im 11/102]
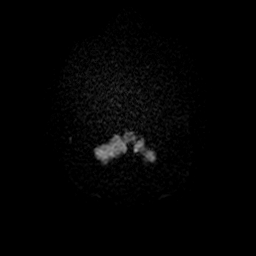
[im 21/102]
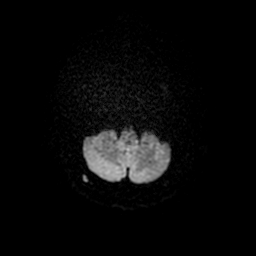
[im 31/102]
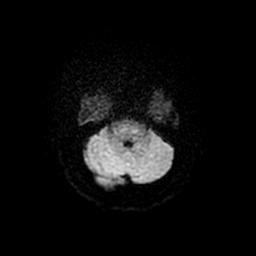
[im 41/102]
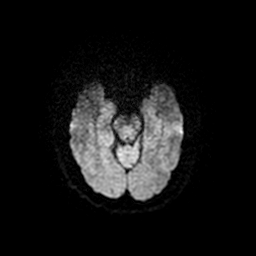
[im 51/102]
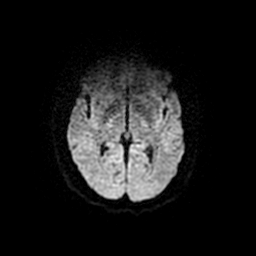
[im 61/102]
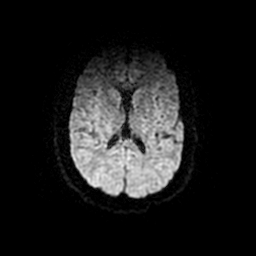
[im 71/102]
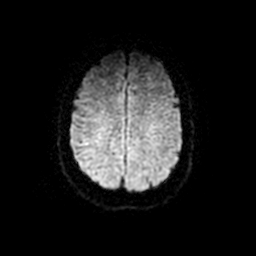
[im 81/102]
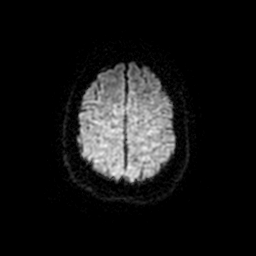
[im 91/102]
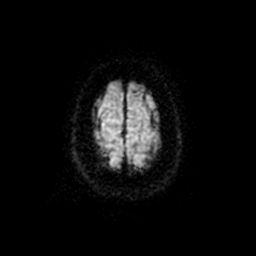
[im 102/102]
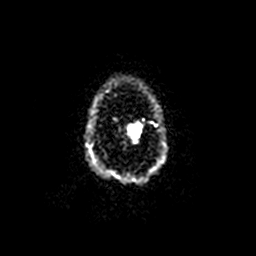

[Series 7: DWI · coronal · 5.0mm · 1.09mm/px · 8 of 70 slices shown (2 of 4)]
[im 1/70]
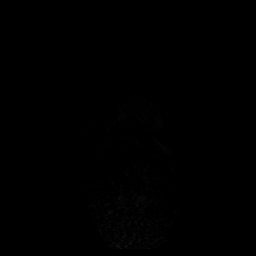
[im 10/70]
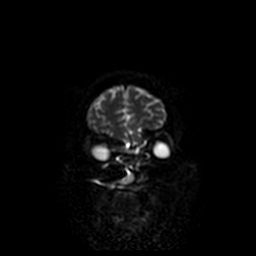
[im 20/70]
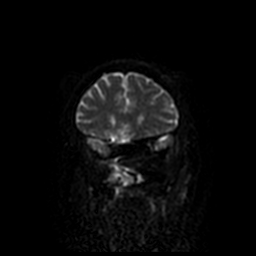
[im 30/70]
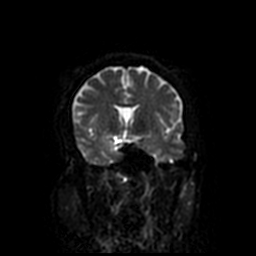
[im 40/70]
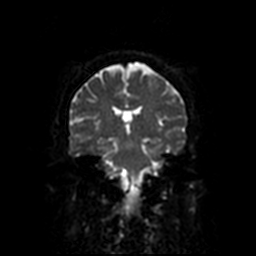
[im 50/70]
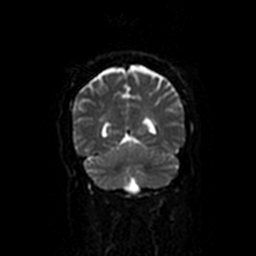
[im 60/70]
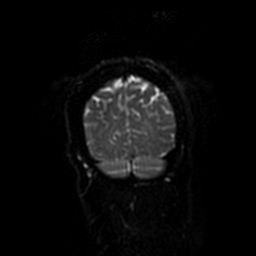
[im 70/70]
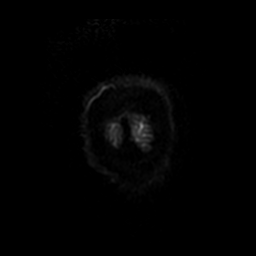

[Series 8: T2 · axial · 5.0mm · 0.43mm/px · z∈[-94,+54]mm · 3 of 28 slices shown (1 of 2)]
[im 1/28]
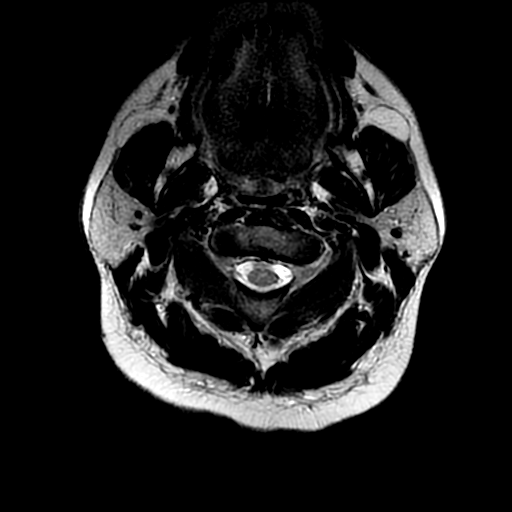
[im 14/28]
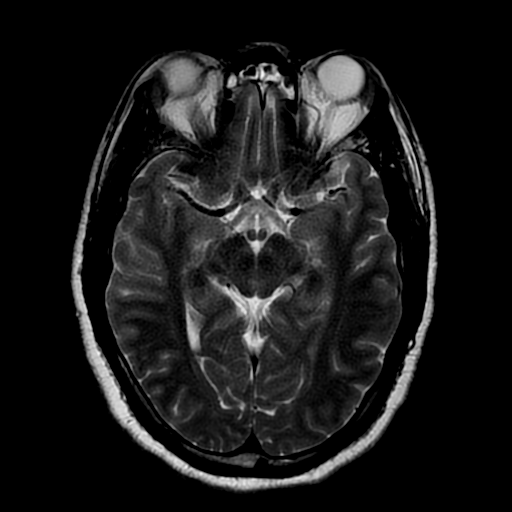
[im 28/28]
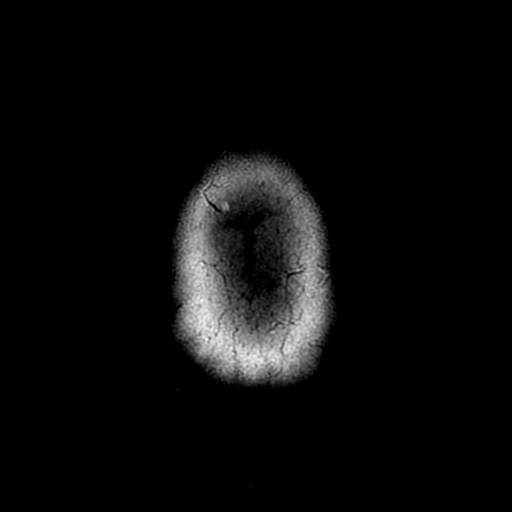

[Series 9: FLAIR · axial · 5.0mm · 0.43mm/px · z∈[-94,+54]mm · 3 of 28 slices shown]
[im 1/28]
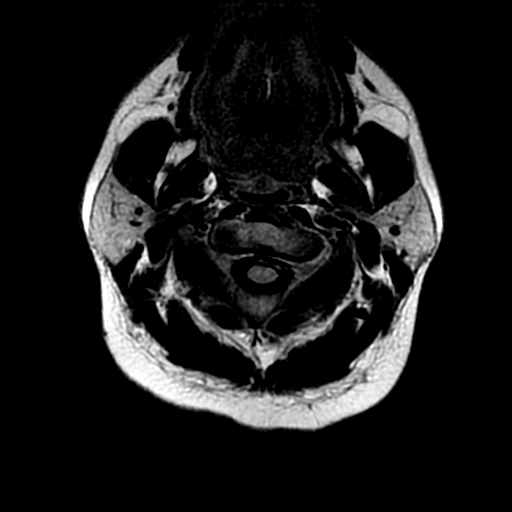
[im 14/28]
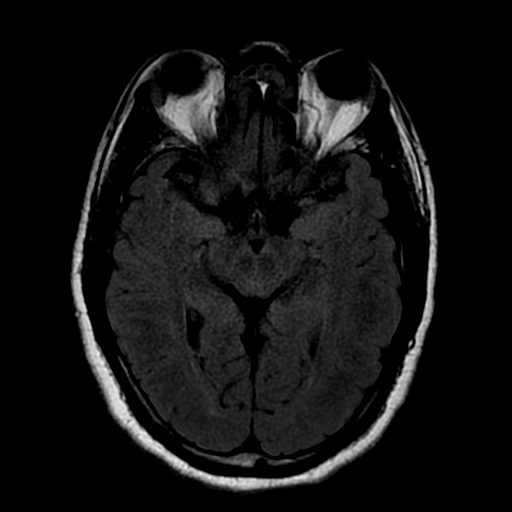
[im 28/28]
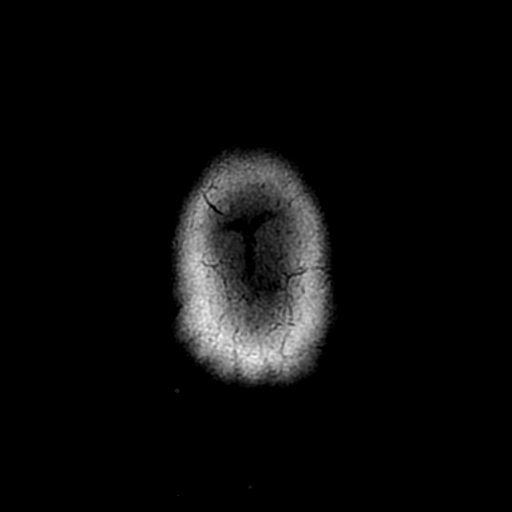

[Series 12: T2 · coronal · 5.0mm · 0.47mm/px · 3 of 24 slices shown (2 of 2)]
[im 1/24]
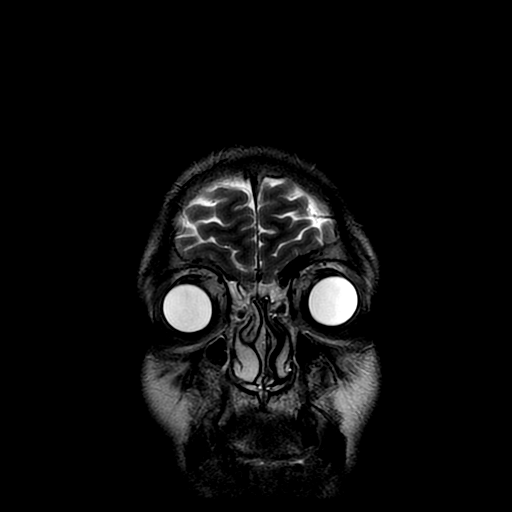
[im 12/24]
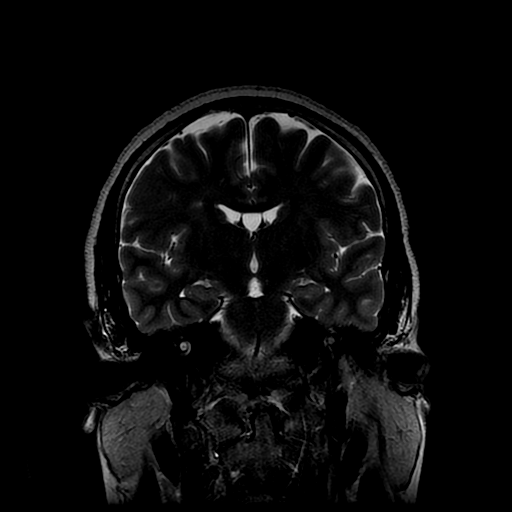
[im 24/24]
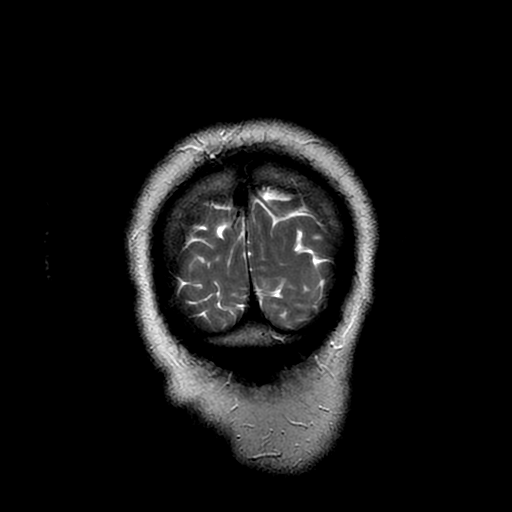

[Series 600: DWI · axial · 3.0mm · 1.09mm/px · z∈[-88,+52]mm · 5 of 51 slices shown (3 of 4)]
[im 1/51]
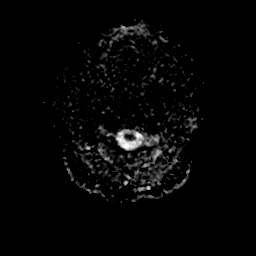
[im 13/51]
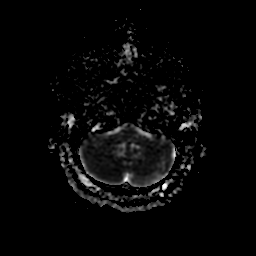
[im 26/51]
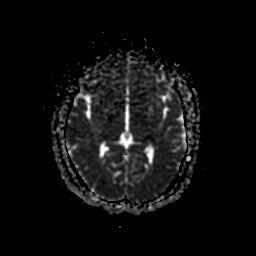
[im 38/51]
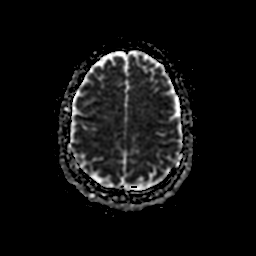
[im 51/51]
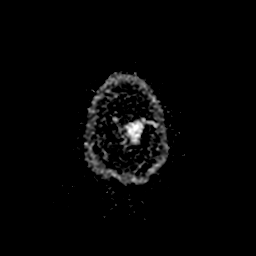

[Series 700: DWI · coronal · 5.0mm · 1.09mm/px · 4 of 35 slices shown (4 of 4)]
[im 1/35]
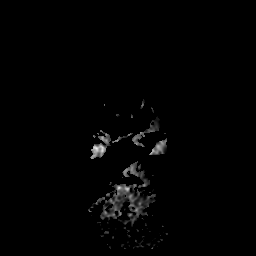
[im 12/35]
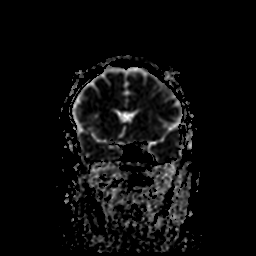
[im 23/35]
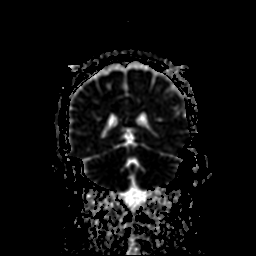
[im 35/35]
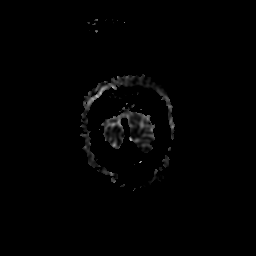

[39 of 48 positions shown; findings below may reference images not displayed]

FINDINGS: Diffusion imaging does not show any acute or subacute infarction.
The brainstem and cerebellum are normal. There are a few small foci
of T2 and FLAIR signal in the hemispheric deep white matter which
could represent old small vessel infarctions. No cortical or large
vessel territory infarction. No mass lesion, hemorrhage,
hydrocephalus or extra-axial collection. No pituitary mass. There
are inflammatory changes of the maxillary sinuses and the ethmoid
sinuses. Major vessels at the base of the brain show flow.
IMPRESSION: No acute intracranial finding. Few old white matter insults
affecting the cerebral hemispheric deep white matter.

Inflammatory changes at the maxillary and ethmoid sinuses.

## 2017-10-03 DIAGNOSIS — M961 Postlaminectomy syndrome, not elsewhere classified: Secondary | ICD-10-CM | POA: Insufficient documentation

## 2017-10-03 DIAGNOSIS — M51369 Other intervertebral disc degeneration, lumbar region without mention of lumbar back pain or lower extremity pain: Secondary | ICD-10-CM | POA: Insufficient documentation

## 2018-09-26 IMAGING — CR DG CHEST 2V
2 series · 2 of 2 positions shown · non-contrast
Comparison: 06/14/2014

CLINICAL DATA: Left chest pain

EXAM:
CHEST  2 VIEW

[w chest pa]
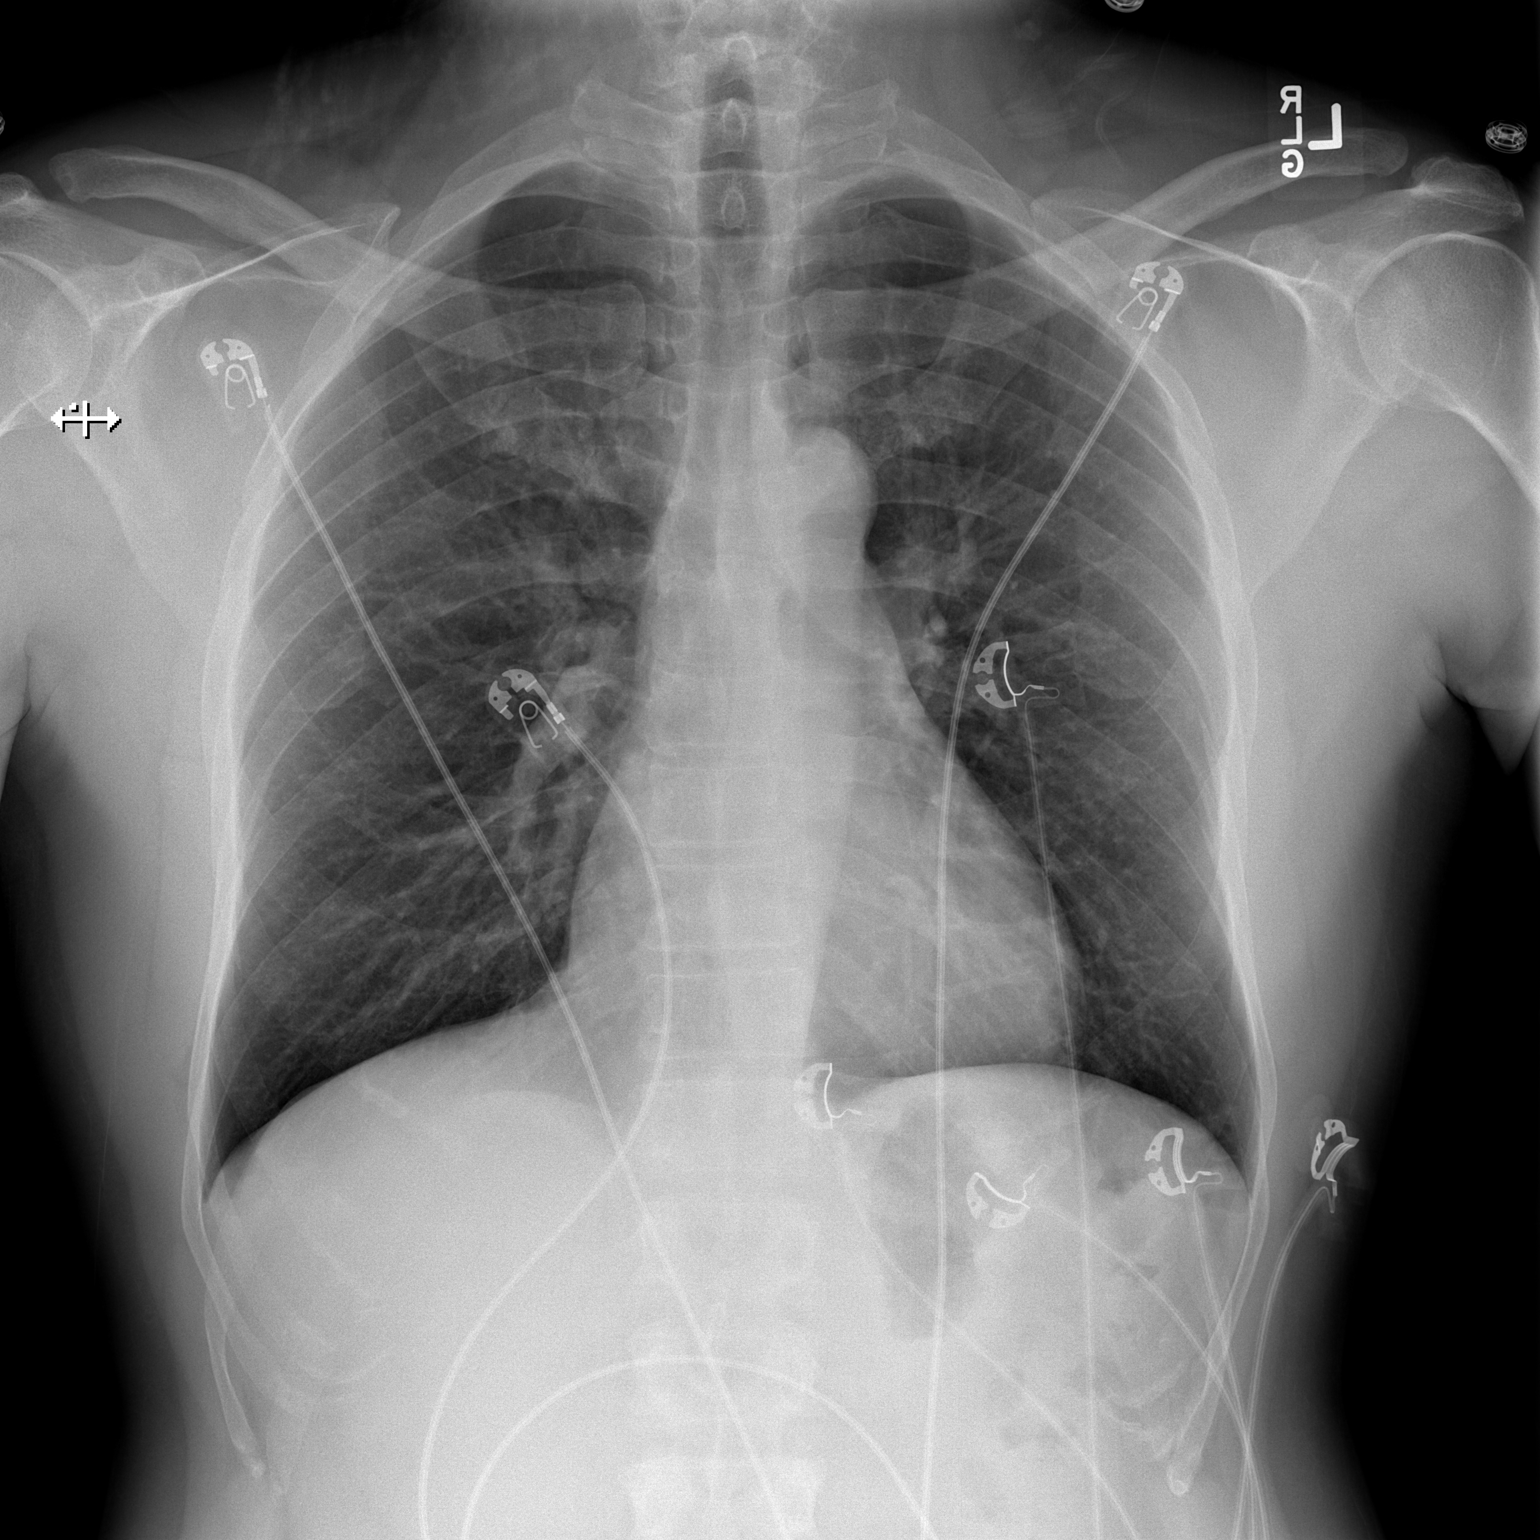

[w chest lat]
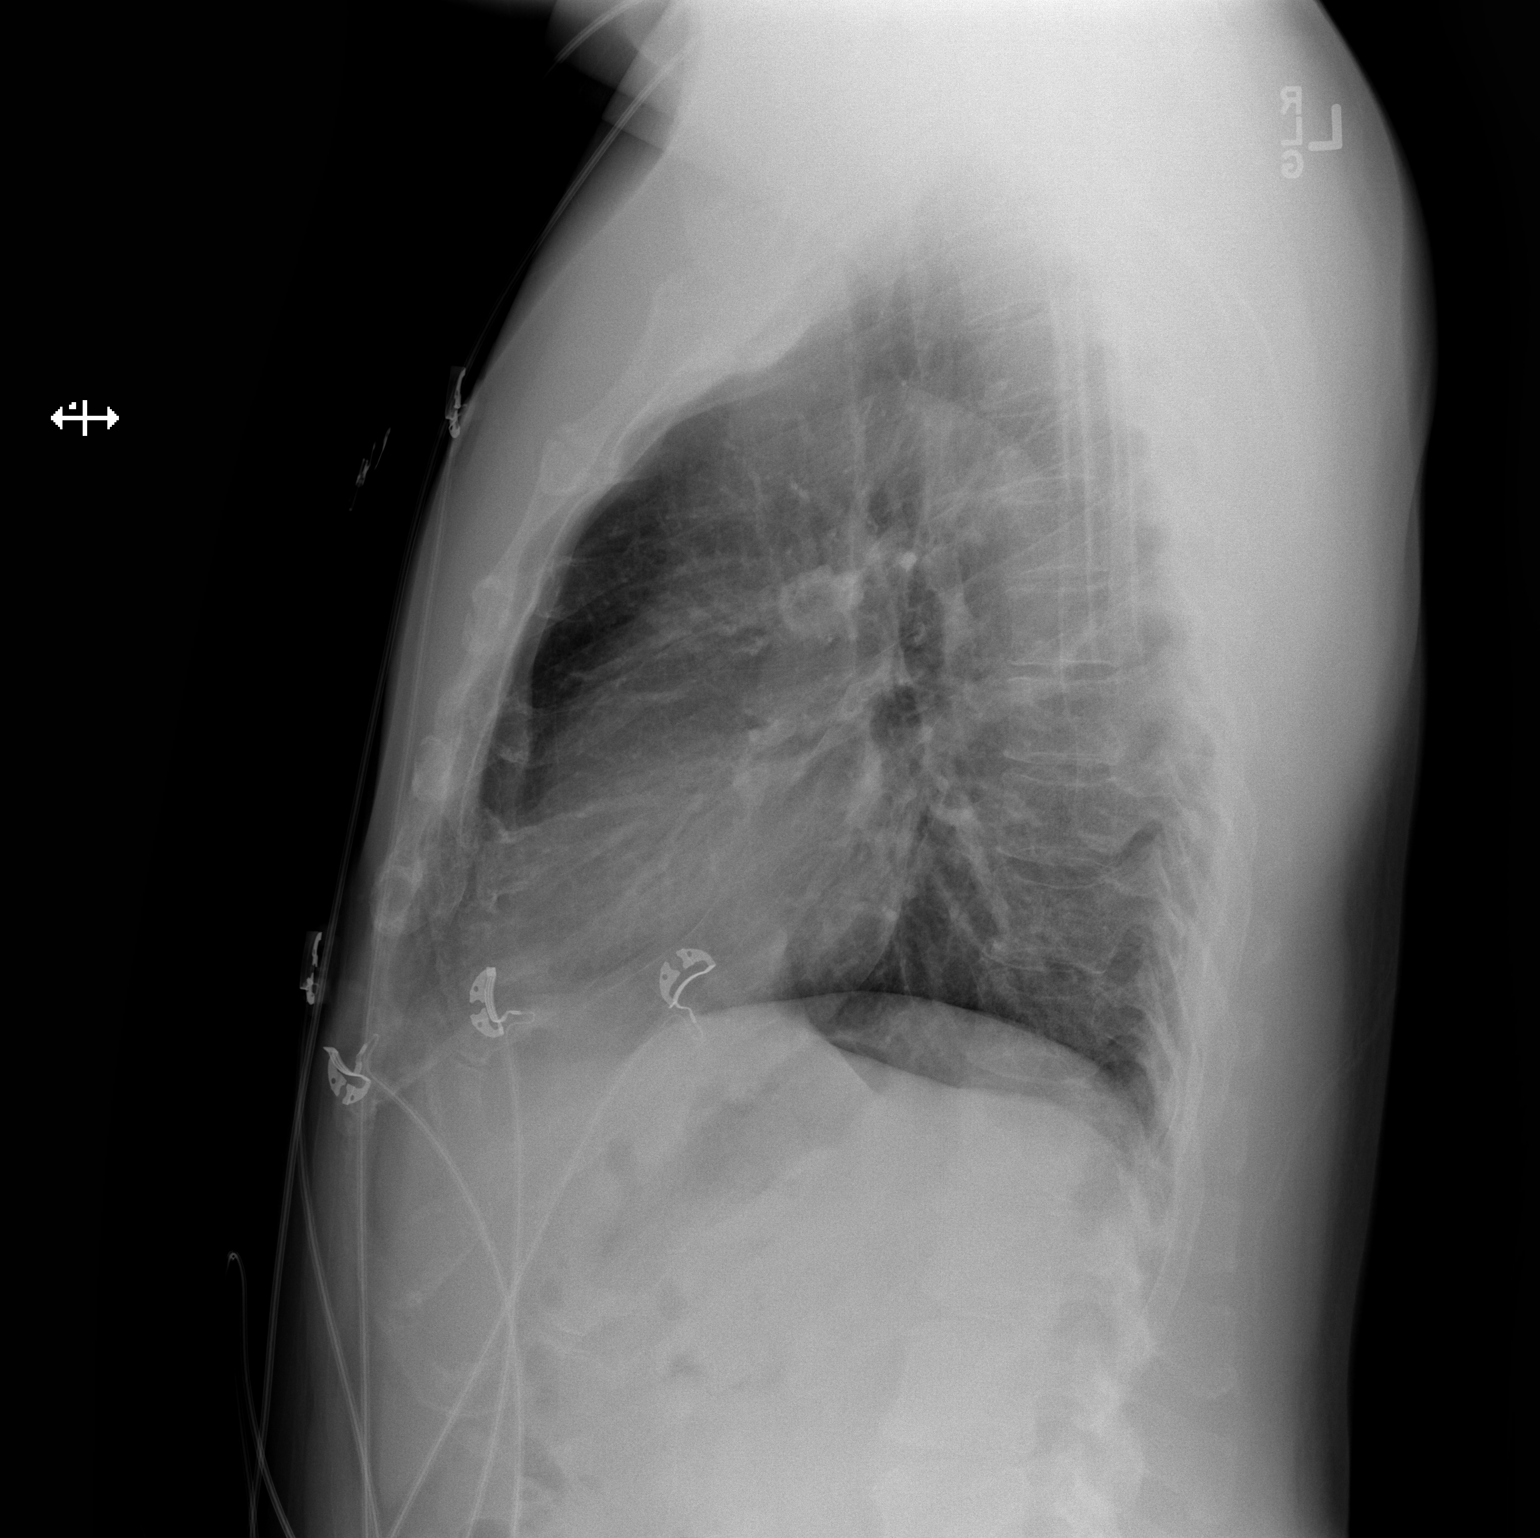

[2 of 2 positions shown; findings below may reference images not displayed]

FINDINGS: Normal heart size and mediastinal contours. Perihilar density in the
lateral projection favors summation shadows; the hila have a stable
normal appearance frontally. No acute infiltrate or edema. No
effusion or pneumothorax. No acute osseous findings.
IMPRESSION: 1. No acute finding.
2. Nodular density overlapping the hila in the lateral projection is
likely summation shadows. After convalescence, recommend two-view
follow-up to ensure this is not a persistent finding.

## 2020-05-03 ENCOUNTER — Other Ambulatory Visit: Payer: Self-pay

## 2020-05-03 ENCOUNTER — Ambulatory Visit
Admission: EM | Admit: 2020-05-03 | Discharge: 2020-05-03 | Disposition: A | Discharge status: Home / Self Care | Payer: BLUE CROSS/BLUE SHIELD | Attending: Emergency Medicine | Admitting: Emergency Medicine

## 2020-05-03 DIAGNOSIS — J019 Acute sinusitis, unspecified: Secondary | ICD-10-CM

## 2020-05-03 MED ORDER — PREDNISONE 20 MG PO TABS: 20.0000 mg | ORAL_TABLET | Freq: Every day | ORAL | 0 refills | Status: AC

## 2020-05-03 MED ORDER — AMOXICILLIN-POT CLAVULANATE 875-125 MG PO TABS: 1.0000 | ORAL_TABLET | Freq: Two times a day (BID) | ORAL | 0 refills | Status: AC

## 2020-05-03 MED ORDER — BENZONATATE 200 MG PO CAPS: 200.0000 mg | ORAL_CAPSULE | Freq: Three times a day (TID) | ORAL | 0 refills | Status: AC | PRN

## 2020-05-03 NOTE — ED Provider Notes (Signed)
[facility]    CSN: [id number] Arrival date & time: 05/03/20  0808      History   Chief Complaint Chief Complaint  Patient presents with  . Nasal Congestion  . Cough    HPI Andre Walters is a 50 y.o. male history of hypertension, GERD, presenting today for evaluation of URI symptoms.  Reports cough, rhinorrhea, headache and fatigue.  Reports symptoms began approximately 8 days ago.  Reports significant sinus pressure as well as has had an episode of left nares bleeding.  Using Zyrtec, Tylenol.  Denies difficulty breathing or shortness of breath.  HPI  Past Medical History:  Diagnosis Date  . Bronchitis   . GERD (gastroesophageal reflux disease)   . Hypertension     There are no problems to display for this patient.   Past Surgical History:  Procedure Laterality Date  . BACK SURGERY    . HERNIA REPAIR         Home Medications    Prior to Admission medications   Medication Sig Start Date End Date Taking? Authorizing Provider  amoxicillin-clavulanate (AUGMENTIN) 875-125 MG tablet Take 1 tablet by mouth every 12 (twelve) hours for 7 days. 05/03/20 05/10/20 Yes Chrystian Ressler C, PA-C  benzonatate (TESSALON) 200 MG capsule Take 1 capsule (200 mg total) by mouth 3 (three) times daily as needed for up to 7 days for cough. 05/03/20 05/10/20 Yes Nayelly Laughman C, PA-C  predniSONE (DELTASONE) 20 MG tablet Take 1 tablet (20 mg total) by mouth daily with breakfast for 5 days. 05/03/20 05/08/20 Yes Mikael Skoda C, PA-C  gabapentin (NEURONTIN) 800 MG tablet Take 800 mg by mouth daily as needed (for pain).    [provider]  hydrochlorothiazide (HYDRODIURIL) 25 MG tablet Take 25 mg by mouth daily.    [provider]  methocarbamol (ROBAXIN) 500 MG tablet Take 2 tablets (1,000 mg total) by mouth every 8 (eight) hours as needed for muscle spasms. 09/02/16   Arby Barrette, MD    Family History Family History  Problem Relation Age of Onset  .  Hypertension Mother     Social History Social History   Tobacco Use  . Smoking status: Never Smoker  . Smokeless tobacco: Never Used  Substance Use Topics  . Alcohol use: No    Alcohol/week: 2.0 standard drinks    Types: 2 Cans of beer per week    Comment: once a month  . Drug use: No     Allergies   Edarbi [azilsartan] and Hydrocodone   Review of Systems Review of Systems  Constitutional: Negative for activity change, appetite change, chills, fatigue and fever.  HENT: Positive for congestion, rhinorrhea and sore throat. Negative for ear pain, sinus pressure and trouble swallowing.   Eyes: Negative for discharge and redness.  Respiratory: Positive for cough. Negative for chest tightness and shortness of breath.   Cardiovascular: Negative for chest pain.  Gastrointestinal: Negative for abdominal pain, diarrhea, nausea and vomiting.  Musculoskeletal: Negative for myalgias.  Skin: Negative for rash.  Neurological: Negative for dizziness, light-headedness and headaches.     Physical Exam Triage Vital Signs ED Triage Vitals  Enc Vitals Group     BP      Pulse      Resp      Temp      Temp src      SpO2      Weight      Height      Head Circumference  Peak Flow      Pain Score      Pain Loc      Pain Edu?      Excl. in GC?    No data found.  Updated Vital Signs BP 140/79 (BP Location: Left Arm)   Pulse 90   Temp 98.7 F (37.1 C) (Oral)   Resp 20   SpO2 98%   Visual Acuity Right Eye Distance:   Left Eye Distance:   Bilateral Distance:    Right Eye Near:   Left Eye Near:    Bilateral Near:     Physical Exam Vitals and nursing note reviewed.  Constitutional:      Appearance: He is well-developed.     Comments: No acute distress  HENT:     Head: Normocephalic and atraumatic.     Ears:     Comments: Bilateral ears without tenderness to palpation of external auricle, tragus and mastoid, EAC's without erythema or swelling, TM's with good bony  landmarks and cone of light. Non erythematous.     Nose: Nose normal.     Mouth/Throat:     Comments: Oral mucosa pink and moist, no tonsillar enlargement or exudate. Posterior pharynx patent and nonerythematous, no uvula deviation or swelling. Normal phonation. Eyes:     Conjunctiva/sclera: Conjunctivae normal.  Cardiovascular:     Rate and Rhythm: Normal rate.  Pulmonary:     Effort: Pulmonary effort is normal. No respiratory distress.     Comments: Breathing comfortably at rest, CTABL, no wheezing, rales or other adventitious sounds auscultated Abdominal:     General: There is no distension.  Musculoskeletal:        General: Normal range of motion.     Cervical back: Neck supple.  Skin:    General: Skin is warm and dry.  Neurological:     Mental Status: He is alert and oriented to person, place, and time.      UC Treatments / Results  Labs (all labs ordered are listed, but only abnormal results are displayed) Labs Reviewed - No data to display  EKG   Radiology No results found.  Procedures Procedures (including critical care time)  Medications Ordered in UC Medications - No data to display  Initial Impression / Assessment and Plan / UC Course  I have reviewed the triage vital signs and the nursing notes.  Pertinent labs & imaging results that were available during my care of the patient were reviewed by me and considered in my medical decision making (see chart for details).     URI symptoms x8 days, treating for sinusitis with Augmentin, prednisone course and continue symptomatic and supportive care with over-the-counter medicine.  Providing Tessalon for cough.  Discussed strict return precautions. Patient verbalized understanding and is agreeable with plan.  Final Clinical Impressions(s) / UC Diagnoses   Final diagnoses:  Acute sinusitis with symptoms > 10 days     Discharge Instructions     Begin Augmentin twice daily for 1 week Prednisone 40 mg  daily for 5 days with food May continue over-the-counter congestion medicine-Zyrtec-D, Mucinex Tessalon every 8 hours for cough Rest and fluids Follow-up if not better or worsening    ED Prescriptions    Medication Sig Dispense Auth. Provider   amoxicillin-clavulanate (AUGMENTIN) 875-125 MG tablet Take 1 tablet by mouth every 12 (twelve) hours for 7 days. 14 tablet Onnika Siebel C, PA-C   predniSONE (DELTASONE) 20 MG tablet Take 1 tablet (20 mg total) by mouth daily with  breakfast for 5 days. 5 tablet Consuelo Thayne C, PA-C   benzonatate (TESSALON) 200 MG capsule Take 1 capsule (200 mg total) by mouth 3 (three) times daily as needed for up to 7 days for cough. 28 capsule Setareh Rom, [place] C, PA-C     PDMP not reviewed this encounter.   Haneef Hallquist, [place] C, PA-C 05/03/20 0900

## 2020-05-03 NOTE — Discharge Instructions (Signed)
Begin Augmentin twice daily for 1 week Prednisone 40 mg daily for 5 days with food May continue over-the-counter congestion medicine-Zyrtec-D, Mucinex Tessalon every 8 hours for cough Rest and fluids Follow-up if not better or worsening

## 2020-05-03 NOTE — ED Triage Notes (Signed)
Pt presents with nasal congestion and cough since last week, pt had covid last month and feels worse

## 2020-11-09 ENCOUNTER — Encounter: Payer: Self-pay | Admitting: Emergency Medicine

## 2020-11-09 ENCOUNTER — Ambulatory Visit
Admission: EM | Admit: 2020-11-09 | Discharge: 2020-11-09 | Disposition: A | Discharge status: Home / Self Care | Payer: BC Managed Care – PPO | Attending: Internal Medicine | Admitting: Internal Medicine

## 2020-11-09 ENCOUNTER — Other Ambulatory Visit: Payer: Self-pay

## 2020-11-09 DIAGNOSIS — Z20822 Contact with and (suspected) exposure to covid-19: Secondary | ICD-10-CM

## 2020-11-09 DIAGNOSIS — J069 Acute upper respiratory infection, unspecified: Secondary | ICD-10-CM | POA: Diagnosis not present

## 2020-11-09 MED ORDER — FLUTICASONE PROPIONATE 50 MCG/ACT NA SUSP: 1.0000 | Freq: Every day | NASAL | 0 refills | Status: AC

## 2020-11-09 MED ORDER — CETIRIZINE HCL 10 MG PO TABS: 10.0000 mg | ORAL_TABLET | Freq: Every day | ORAL | 0 refills | Status: AC

## 2020-11-09 MED ORDER — BENZONATATE 100 MG PO CAPS: 100.0000 mg | ORAL_CAPSULE | Freq: Three times a day (TID) | ORAL | 0 refills | Status: DC | PRN

## 2020-11-09 NOTE — Discharge Instructions (Signed)
You likely having a viral upper respiratory infection. We recommended symptom control. I expect your symptoms to start improving in the next 1-2 weeks.   1. Take a daily allergy pill/anti-histamine like Zyrtec, Claritin, or Store brand consistently for 2 weeks.  You have been prescribed this medication.  2. For congestion you may try an oral decongestant like Mucinex. You may also try intranasal flonase nasal spray or saline irrigations (neti pot, sinus cleanse).  You have been prescribed Flonase.  3. For your sore throat you may try cepacol lozenges, salt water gargles, throat spray. Treatment of congestion may also help your sore throat.  4. For cough you may try the medication that was prescribed.  5. Take Tylenol or Ibuprofen to help with pain/inflammation  6. Stay hydrated, drink plenty of fluids to keep throat coated and less irritated  Honey Tea For cough/sore throat try using a honey-based tea. Use 3 teaspoons of honey with juice squeezed from half lemon. Place shaved pieces of ginger into 1/2-1 cup of water and warm over stove top. Then mix the ingredients and repeat every 4 hours as needed.   Your COVID-19 test is pending.  We will call if it is positive.

## 2020-11-09 NOTE — ED Triage Notes (Signed)
10/13 started experiencing generalized body aches, chills, fatigue, headache, cough, and congestion. Using tea and "concoction of white maid" to help. Experiencing night sweats as well.

## 2020-11-09 NOTE — ED Provider Notes (Addendum)
[facility]    CSN: [id number] Arrival date & time: 11/09/20  0805      History   Chief Complaint Chief Complaint  Patient presents with  . Generalized Body Aches    HPI Andre Walters is a 50 y.o. male.   Patient presents with 5 to 6-day history of generalized body aches, chills, fatigue, headache, productive cough, nasal congestion.  Patient reports that he has been using "home remedies" that include teas and "white maid".  Patient reports that these have provided minimal improvement in symptoms.  Cough is productive with yellow sputum.  Denies any known fevers.  Children have had similar symptoms recently.  Denies any chest pain or shortness of breath.    Past Medical History:  Diagnosis Date  . Bronchitis   . GERD (gastroesophageal reflux disease)   . Hypertension     There are no problems to display for this patient.   Past Surgical History:  Procedure Laterality Date  . BACK SURGERY    . HERNIA REPAIR         Home Medications    Prior to Admission medications   Medication Sig Start Date End Date Taking? Authorizing Provider  benzonatate (TESSALON) 100 MG capsule Take 1 capsule (100 mg total) by mouth every 8 (eight) hours as needed for cough. 11/09/20  Yes Lance Muss, FNP  cetirizine (ZYRTEC) 10 MG tablet Take 1 tablet (10 mg total) by mouth daily for 10 days. 11/09/20 11/19/20 Yes Lance Muss, FNP  fluticasone (FLONASE) 50 MCG/ACT nasal spray Place 1 spray into both nostrils daily for 3 days. 11/09/20 11/12/20 Yes Lance Muss, FNP  gabapentin (NEURONTIN) 800 MG tablet Take 800 mg by mouth daily as needed (for pain).    [provider]  hydrochlorothiazide (HYDRODIURIL) 25 MG tablet Take 25 mg by mouth daily.    [provider]  methocarbamol (ROBAXIN) 500 MG tablet Take 2 tablets (1,000 mg total) by mouth every 8 (eight) hours as needed for muscle spasms. 09/02/16   Arby Barrette, MD    Family History Family  History  Problem Relation Age of Onset  . Hypertension Mother     Social History Social History   Tobacco Use  . Smoking status: Never  . Smokeless tobacco: Never  Substance Use Topics  . Alcohol use: No    Alcohol/week: 2.0 standard drinks    Types: 2 Cans of beer per week    Comment: once a month  . Drug use: No     Allergies   Edarbi [azilsartan] and Hydrocodone   Review of Systems Review of Systems Per HPI  Physical Exam Triage Vital Signs ED Triage Vitals [11/09/20 0817]  Enc Vitals Group     BP 113/80     Pulse Rate (!) 115     Resp 16     Temp 98.1 F (36.7 C)     Temp Source Oral     SpO2 98 %     Weight      Height      Head Circumference      Peak Flow      Pain Score 8     Pain Loc      Pain Edu?      Excl. in GC?    No data found.  Updated Vital Signs BP 113/80 (BP Location: Left Arm)   Pulse (!) 115   Temp 98.1 F (36.7 C) (Oral)   Resp 16   SpO2  98%   Visual Acuity Right Eye Distance:   Left Eye Distance:   Bilateral Distance:    Right Eye Near:   Left Eye Near:    Bilateral Near:     Physical Exam Constitutional:      General: He is not in acute distress.    Appearance: Normal appearance. He is not toxic-appearing or diaphoretic.  HENT:     Head: Normocephalic and atraumatic.     Right Ear: Ear canal normal. A middle ear effusion is present. Tympanic membrane is not erythematous or bulging.     Left Ear: Ear canal normal. A middle ear effusion is present. Tympanic membrane is not erythematous or bulging.     Nose: Congestion present.     Mouth/Throat:     Mouth: Mucous membranes are moist.     Pharynx: No posterior oropharyngeal erythema.  Eyes:     Extraocular Movements: Extraocular movements intact.     Conjunctiva/sclera: Conjunctivae normal.     Pupils: Pupils are equal, round, and reactive to light.  Cardiovascular:     Rate and Rhythm: Normal rate and regular rhythm.     Pulses: Normal pulses.     Heart sounds:  Normal heart sounds.  Pulmonary:     Effort: Pulmonary effort is normal. No respiratory distress.     Breath sounds: Normal breath sounds. No stridor. No wheezing or rhonchi.  Abdominal:     General: Abdomen is flat. Bowel sounds are normal.     Palpations: Abdomen is soft.  Musculoskeletal:        General: Normal range of motion.     Cervical back: Normal range of motion.  Skin:    General: Skin is warm and dry.  Neurological:     General: No focal deficit present.     Mental Status: He is alert and oriented to person, place, and time. Mental status is at baseline.  Psychiatric:        Mood and Affect: Mood normal.        Behavior: Behavior normal.     UC Treatments / Results  Labs (all labs ordered are listed, but only abnormal results are displayed) Labs Reviewed  NOVEL CORONAVIRUS, NAA    EKG   Radiology No results found.  Procedures Procedures (including critical care time)  Medications Ordered in UC Medications - No data to display  Initial Impression / Assessment and Plan / UC Course  I have reviewed the triage vital signs and the nursing notes.  Pertinent labs & imaging results that were available during my care of the patient were reviewed by me and considered in my medical decision making (see chart for details).     Patient presents with symptoms likely from a viral upper respiratory infection. Differential includes bacterial pneumonia, sinusitis, allergic rhinitis, Covid 19. Do not suspect underlying cardiopulmonary process. Symptoms seem unlikely related to ACS, CHF or COPD exacerbations, pneumonia, pneumothorax. Patient is nontoxic appearing and not in need of emergent medical intervention.  COVID-19 PCR is pending.  Recommended symptom control with over the counter medications that are safe with high blood pressure: Daily oral anti-histamine, Oral decongestant or IN corticosteroid, saline irrigations, cepacol lozenges, Robitussin, Delsym, honey tea.   Advised patient to avoid medications that end in D or DM or Sudafed.  Patient was offered prescriptions.  Return if symptoms fail to improve in 1-2 weeks or you develop shortness of breath, chest pain, severe headache. Patient states understanding and is agreeable.  Discharged with PCP  followup.  Final Clinical Impressions(s) / UC Diagnoses   Final diagnoses:  Viral upper respiratory tract infection with cough  Encounter for laboratory testing for COVID-19 virus     Discharge Instructions      You likely having a viral upper respiratory infection. We recommended symptom control. I expect your symptoms to start improving in the next 1-2 weeks.   1. Take a daily allergy pill/anti-histamine like Zyrtec, Claritin, or Store brand consistently for 2 weeks.  You have been prescribed this medication.  2. For congestion you may try an oral decongestant like Mucinex. You may also try intranasal flonase nasal spray or saline irrigations (neti pot, sinus cleanse).  You have been prescribed Flonase.  3. For your sore throat you may try cepacol lozenges, salt water gargles, throat spray. Treatment of congestion may also help your sore throat.  4. For cough you may try the medication that was prescribed.  5. Take Tylenol or Ibuprofen to help with pain/inflammation  6. Stay hydrated, drink plenty of fluids to keep throat coated and less irritated  Honey Tea For cough/sore throat try using a honey-based tea. Use 3 teaspoons of honey with juice squeezed from half lemon. Place shaved pieces of ginger into 1/2-1 cup of water and warm over stove top. Then mix the ingredients and repeat every 4 hours as needed.   Your COVID-19 test is pending.  We will call if it is positive.     ED Prescriptions     Medication Sig Dispense Auth. Provider   cetirizine (ZYRTEC) 10 MG tablet Take 1 tablet (10 mg total) by mouth daily for 10 days. 30 tablet Lance Muss, FNP   fluticasone (FLONASE) 50 MCG/ACT  nasal spray Place 1 spray into both nostrils daily for 3 days. 16 g Lance Muss, FNP   benzonatate (TESSALON) 100 MG capsule Take 1 capsule (100 mg total) by mouth every 8 (eight) hours as needed for cough. 21 capsule Lance Muss, FNP      PDMP not reviewed this encounter.   Lance Muss, FNP 11/09/20 0855    Lance Muss, FNP 11/09/20 [phone]

## 2020-11-10 LAB — SARS-COV-2, NAA 2 DAY TAT

## 2020-11-10 LAB — NOVEL CORONAVIRUS, NAA: SARS-CoV-2, NAA: NOT DETECTED

## 2020-11-21 ENCOUNTER — Encounter: Payer: Self-pay | Admitting: *Deleted

## 2020-11-21 ENCOUNTER — Ambulatory Visit
Admission: EM | Admit: 2020-11-21 | Discharge: 2020-11-21 | Disposition: A | Discharge status: Home / Self Care | Payer: BC Managed Care – PPO | Attending: Physician Assistant | Admitting: Physician Assistant

## 2020-11-21 DIAGNOSIS — J209 Acute bronchitis, unspecified: Secondary | ICD-10-CM

## 2020-11-21 DIAGNOSIS — H60501 Unspecified acute noninfective otitis externa, right ear: Secondary | ICD-10-CM | POA: Diagnosis not present

## 2020-11-21 MED ORDER — PREDNISONE 20 MG PO TABS: 40.0000 mg | ORAL_TABLET | Freq: Every day | ORAL | 0 refills | Status: AC

## 2020-11-21 MED ORDER — OFLOXACIN 0.3 % OT SOLN: 5.0000 [drp] | Freq: Every day | OTIC | 0 refills | Status: AC

## 2020-11-21 NOTE — ED Provider Notes (Signed)
[facility]    CSN: [id number] Arrival date & time: 11/21/20  0801      History   Chief Complaint Chief Complaint  Patient presents with   Ear Pain    HPI Andre Walters is a 50 y.o. male.   Patient here today for evaluation of right ear pain that has been ongoing for the last few weeks was worsened over the last 24 hours.  He states that movement of his external ear causes pain internally.  He has recently had a viral upper respiratory illness, and states that he does have a lingering cough.  He was seen for the same in the middle of this month, and COVID screening was negative at that time.  He has not had any fever.  He has tried using peroxide in his ear without significant relief.  The history is provided by the patient.   Past Medical History:  Diagnosis Date   Bronchitis    GERD (gastroesophageal reflux disease)    Hypertension     There are no problems to display for this patient.   Past Surgical History:  Procedure Laterality Date   BACK SURGERY     HERNIA REPAIR         Home Medications    Prior to Admission medications   Medication Sig Start Date End Date Taking? Authorizing Provider  gabapentin (NEURONTIN) 800 MG tablet Take 800 mg by mouth daily as needed (for pain).   Yes [provider]  hydrochlorothiazide (HYDRODIURIL) 25 MG tablet Take 25 mg by mouth daily.   Yes [provider]  ofloxacin (FLOXIN) 0.3 % OTIC solution Place 5 drops into the right ear daily for 7 days. 11/21/20 11/28/20 Yes Tomi Bamberger, PA-C  predniSONE (DELTASONE) 20 MG tablet Take 2 tablets (40 mg total) by mouth daily with breakfast for 5 days. 11/21/20 11/26/20 Yes Tomi Bamberger, PA-C  benzonatate (TESSALON) 100 MG capsule Take 1 capsule (100 mg total) by mouth every 8 (eight) hours as needed for cough. 11/09/20   Gustavus Bryant, FNP  cetirizine (ZYRTEC) 10 MG tablet Take 1 tablet (10 mg total) by mouth daily for 10 days. 11/09/20 11/19/20   Gustavus Bryant, FNP  fluticasone (FLONASE) 50 MCG/ACT nasal spray Place 1 spray into both nostrils daily for 3 days. 11/09/20 11/12/20  Gustavus Bryant, FNP  methocarbamol (ROBAXIN) 500 MG tablet Take 2 tablets (1,000 mg total) by mouth every 8 (eight) hours as needed for muscle spasms. 09/02/16   Arby Barrette, MD    Family History Family History  Problem Relation Age of Onset   Hypertension Mother     Social History Social History   Tobacco Use   Smoking status: Never   Smokeless tobacco: Never  Vaping Use   Vaping Use: Never used  Substance Use Topics   Alcohol use: No    Alcohol/week: 2.0 standard drinks    Types: 2 Cans of beer per week    Comment: rarely   Drug use: No     Allergies   Edarbi [azilsartan] and Hydrocodone   Review of Systems Review of Systems  Constitutional:  Negative for chills and fever.  HENT:  Positive for ear pain. Negative for congestion, ear discharge and sore throat.   Eyes:  Negative for discharge and redness.  Respiratory:  Positive for cough. Negative for shortness of breath.   Gastrointestinal:  Negative for abdominal pain, nausea and vomiting.    Physical Exam Triage Vital Signs  ED Triage Vitals [11/21/20 0808]  Enc Vitals Group     BP 133/88     Pulse Rate (!) 102     Resp 14     Temp 98.2 F (36.8 C)     Temp Source Oral     SpO2 99 %     Weight      Height      Head Circumference      Peak Flow      Pain Score 10     Pain Loc      Pain Edu?      Excl. in GC?    No data found.  Updated Vital Signs BP 133/88   Pulse (!) 102   Temp 98.2 F (36.8 C) (Oral)   Resp 14   SpO2 99%   Physical Exam Vitals and nursing note reviewed.  Constitutional:      General: He is not in acute distress.    Appearance: Normal appearance. He is not ill-appearing.  HENT:     Head: Normocephalic and atraumatic.     Left Ear: There is impacted cerumen.     Ears:     Comments: Right EAC erythematous, inflamed, unable to  visualize TM, auricle movement pain noted    Nose: Nose normal. No congestion.     Mouth/Throat:     Mouth: Mucous membranes are moist.     Pharynx: Oropharynx is clear. No oropharyngeal exudate or posterior oropharyngeal erythema.  Eyes:     Conjunctiva/sclera: Conjunctivae normal.  Cardiovascular:     Rate and Rhythm: Normal rate and regular rhythm.     Heart sounds: Normal heart sounds. No murmur heard. Pulmonary:     Effort: Pulmonary effort is normal. No respiratory distress.     Breath sounds: Normal breath sounds. No wheezing, rhonchi or rales.  Skin:    General: Skin is warm and dry.  Neurological:     Mental Status: He is alert.  Psychiatric:        Mood and Affect: Mood normal.        Thought Content: Thought content normal.     UC Treatments / Results  Labs (all labs ordered are listed, but only abnormal results are displayed) Labs Reviewed - No data to display  EKG   Radiology No results found.  Procedures Procedures (including critical care time)  Medications Ordered in UC Medications - No data to display  Initial Impression / Assessment and Plan / UC Course  I have reviewed the triage vital signs and the nursing notes.  Pertinent labs & imaging results that were available during my care of the patient were reviewed by me and considered in my medical decision making (see chart for details).  Antibiotic eardrop prescribed for suspected otitis externa, and oral steroids prescribed to hopefully help with inflammation as well as suspected bronchitis given lingering cough.  Encouraged follow-up if symptoms fail to improve or worsen anyway.  Final Clinical Impressions(s) / UC Diagnoses   Final diagnoses:  Acute bronchitis, unspecified organism  Acute otitis externa of right ear, unspecified type   Discharge Instructions   None    ED Prescriptions     Medication Sig Dispense Auth. Provider   predniSONE (DELTASONE) 20 MG tablet Take 2 tablets (40 mg  total) by mouth daily with breakfast for 5 days. 10 tablet Erma Pinto F, PA-C   ofloxacin (FLOXIN) 0.3 % OTIC solution Place 5 drops into the right ear daily for 7 days. 5 mL Izola Price,  Armandina Stammer, PA-C      PDMP not reviewed this encounter.   Tomi Bamberger, PA-C 11/21/20 [phone]

## 2020-11-21 NOTE — ED Triage Notes (Signed)
C/O right ear pain x approx 2 wks.  C/O tenderness when manipulating pinna or ear canal of right ear.

## 2021-04-05 ENCOUNTER — Ambulatory Visit
Admission: EM | Admit: 2021-04-05 | Discharge: 2021-04-05 | Disposition: A | Discharge status: Home / Self Care | Payer: BC Managed Care – PPO | Attending: Physician Assistant | Admitting: Physician Assistant

## 2021-04-05 ENCOUNTER — Other Ambulatory Visit: Payer: Self-pay

## 2021-04-05 DIAGNOSIS — K047 Periapical abscess without sinus: Secondary | ICD-10-CM

## 2021-04-05 MED ORDER — AMOXICILLIN 500 MG PO CAPS: 500.0000 mg | ORAL_CAPSULE | Freq: Three times a day (TID) | ORAL | 0 refills | Status: DC

## 2021-04-05 NOTE — ED Provider Notes (Signed)
?[facility] ? ? ? ?CSN: [id number] ?Arrival date & time: 04/05/21  1531 ? ? ?  ? ?History   ?Chief Complaint ?Chief Complaint  ?Patient presents with  ? Dental Pain  ? ? ?HPI ?Andre Walters is a 51 y.o. male.  ? ?Patient here today for evaluation of left upper dental pain that has been ongoing for 4 days. He notes that pain has caused some headache as well. He has history of dental abscess and currently wears partial dentures. He has felt feverish at times. He has tried taking aleve without significant relief.  ? ?The history is provided by the patient.  ? ?Past Medical History:  ?Diagnosis Date  ? Bronchitis   ? GERD (gastroesophageal reflux disease)   ? Hypertension   ? ? ?There are no problems to display for this patient. ? ? ?Past Surgical History:  ?Procedure Laterality Date  ? BACK SURGERY    ? HERNIA REPAIR    ? ? ? ? ? ?Home Medications   ? ?Prior to Admission medications   ?Medication Sig Start Date End Date Taking? Authorizing Provider  ?amoxicillin (AMOXIL) 500 MG capsule Take 1 capsule (500 mg total) by mouth 3 (three) times daily. 04/05/21  Yes Tomi Bamberger, PA-C  ?benzonatate (TESSALON) 100 MG capsule Take 1 capsule (100 mg total) by mouth every 8 (eight) hours as needed for cough. 11/09/20   Gustavus Bryant, FNP  ?cetirizine (ZYRTEC) 10 MG tablet Take 1 tablet (10 mg total) by mouth daily for 10 days. 11/09/20 11/19/20  Gustavus Bryant, FNP  ?fluticasone (FLONASE) 50 MCG/ACT nasal spray Place 1 spray into both nostrils daily for 3 days. 11/09/20 11/12/20  Gustavus Bryant, FNP  ?gabapentin (NEURONTIN) 800 MG tablet Take 800 mg by mouth daily as needed (for pain).    [provider]  ?hydrochlorothiazide (HYDRODIURIL) 25 MG tablet Take 25 mg by mouth daily.    [provider]  ?methocarbamol (ROBAXIN) 500 MG tablet Take 2 tablets (1,000 mg total) by mouth every 8 (eight) hours as needed for muscle spasms. 09/02/16   Arby Barrette, MD  ? ? ?Family History ?Family History   ?Problem Relation Age of Onset  ? Hypertension Mother   ? ? ?Social History ?Social History  ? ?Tobacco Use  ? Smoking status: Never  ? Smokeless tobacco: Never  ?Vaping Use  ? Vaping Use: Never used  ?Substance Use Topics  ? Alcohol use: No  ?  Alcohol/week: 2.0 standard drinks  ?  Types: 2 Cans of beer per week  ?  Comment: rarely  ? Drug use: No  ? ? ? ?Allergies   ?Edarbi [azilsartan] and Hydrocodone ? ? ?Review of Systems ?Review of Systems  ?Constitutional:  Negative for chills and fever.  ?HENT:  Positive for dental problem.   ?Eyes:  Negative for discharge and redness.  ?Neurological:  Negative for numbness.  ? ? ?Physical Exam ?Triage Vital Signs ?ED Triage Vitals  ?Enc Vitals Group  ?   BP   ?   Pulse   ?   Resp   ?   Temp   ?   Temp src   ?   SpO2   ?   Weight   ?   Height   ?   Head Circumference   ?   Peak Flow   ?   Pain Score   ?   Pain Loc   ?   Pain Edu?   ?  Excl. in GC?   ? ?No data found. ? ?Updated Vital Signs ?BP (!) 156/93 (BP Location: Right Arm)   Pulse 91   Temp 98 ?F (36.7 ?C) (Oral)   Resp 17   SpO2 96%  ?   ? ?Physical Exam ?Vitals and nursing note reviewed.  ?Constitutional:   ?   General: He is not in acute distress. ?   Appearance: Normal appearance. He is not ill-appearing.  ?HENT:  ?   Head: Normocephalic and atraumatic.  ?   Mouth/Throat:  ?   Comments: Gingival swelling noted to posterior upper left molars ?Eyes:  ?   Conjunctiva/sclera: Conjunctivae normal.  ?Cardiovascular:  ?   Rate and Rhythm: Normal rate.  ?Pulmonary:  ?   Effort: Pulmonary effort is normal.  ?Neurological:  ?   Mental Status: He is alert.  ?Psychiatric:     ?   Mood and Affect: Mood normal.     ?   Behavior: Behavior normal.     ?   Thought Content: Thought content normal.  ? ? ? ?UC Treatments / Results  ?Labs ?(all labs ordered are listed, but only abnormal results are displayed) ?Labs Reviewed - No data to display ? ?EKG ? ? ?Radiology ?No results found. ? ?Procedures ?Procedures (including critical  care time) ? ?Medications Ordered in UC ?Medications - No data to display ? ?Initial Impression / Assessment and Plan / UC Course  ?I have reviewed the triage vital signs and the nursing notes. ? ?Pertinent labs & imaging results that were available during my care of the patient were reviewed by me and considered in my medical decision making (see chart for details). ? ?  ?Will treat to cover abscess with antibiotic and recommended follow up with dentist. Encouraged further evaluation in the ED with any worsening symptoms.  ? ?Final Clinical Impressions(s) / UC Diagnoses  ? ?Final diagnoses:  ?Dental abscess  ? ?Discharge Instructions   ?None ?  ? ?ED Prescriptions   ? ? Medication Sig Dispense Auth. Provider  ? amoxicillin (AMOXIL) 500 MG capsule Take 1 capsule (500 mg total) by mouth 3 (three) times daily. 21 capsule Tomi Bamberger, PA-C  ? ?  ? ?PDMP not reviewed this encounter. ?  ?Tomi Bamberger, PA-C ?04/05/21 1724 ? ?

## 2021-04-05 NOTE — ED Triage Notes (Signed)
Pt presents with left side dental pain that is causing headache X 4 days. ?

## 2022-08-07 DIAGNOSIS — K219 Gastro-esophageal reflux disease without esophagitis: Secondary | ICD-10-CM | POA: Insufficient documentation

## 2022-08-07 DIAGNOSIS — I10 Essential (primary) hypertension: Secondary | ICD-10-CM | POA: Insufficient documentation

## 2023-03-24 ENCOUNTER — Ambulatory Visit
Admission: EM | Admit: 2023-03-24 | Discharge: 2023-03-24 | Disposition: A | Discharge status: Home / Self Care | Payer: BC Managed Care – PPO | Attending: Family Medicine | Admitting: Family Medicine

## 2023-03-24 DIAGNOSIS — J069 Acute upper respiratory infection, unspecified: Secondary | ICD-10-CM | POA: Diagnosis not present

## 2023-03-24 LAB — POC COVID19/FLU A&B COMBO
Covid Antigen, POC: NEGATIVE
Influenza A Antigen, POC: NEGATIVE
Influenza B Antigen, POC: NEGATIVE

## 2023-03-24 MED ORDER — BENZONATATE 200 MG PO CAPS: 200.0000 mg | ORAL_CAPSULE | Freq: Three times a day (TID) | ORAL | 0 refills | Status: AC | PRN

## 2023-03-24 NOTE — Discharge Instructions (Signed)
 You were seen today for upper respiratory symptoms.  Your flu and covid swab was negative today.  You likely have another virus causing symptoms.  I have sent out a medication to help with the cough.  Please get plenty of rest and fluids.   Return if not improving or worsening.

## 2023-03-24 NOTE — ED Provider Notes (Signed)
 [facility]    CSN: [id number] Arrival date & time: 03/24/23  0801      History   Chief Complaint Chief Complaint  Patient presents with   Cough   Fever   Nasal Congestion    HPI Andre Walters is a 53 y.o. male.    Cough Associated symptoms: fever, myalgias and rhinorrhea   Fever Associated symptoms: congestion, cough, myalgias and rhinorrhea    Patient is here for URI symptoms x 4 days.  Cough, sneezing, sweats, chills, body aches.  No wheezing or sob.  Last night he used dayquil.        Past Medical History:  Diagnosis Date   Bronchitis    GERD (gastroesophageal reflux disease)    Hypertension     Patient Active Problem List   Diagnosis Date Noted   Degeneration of lumbar intervertebral disc 10/03/2017   Lumbar post-laminectomy syndrome 10/03/2017    Past Surgical History:  Procedure Laterality Date   BACK SURGERY     HERNIA REPAIR         Home Medications    Prior to Admission medications   Medication Sig Start Date End Date Taking? Authorizing Provider  hydrochlorothiazide (HYDRODIURIL) 12.5 MG tablet Take 1 tablet by mouth daily. 03/17/22  Yes [provider]  amoxicillin (AMOXIL) 500 MG capsule Take 1 capsule (500 mg total) by mouth 3 (three) times daily. 04/05/21   Tomi Bamberger, PA-C  benzonatate (TESSALON) 100 MG capsule Take 1 capsule (100 mg total) by mouth every 8 (eight) hours as needed for cough. 11/09/20   Gustavus Bryant, FNP  cetirizine (ZYRTEC) 10 MG tablet Take 1 tablet (10 mg total) by mouth daily for 10 days. 11/09/20 11/19/20  Gustavus Bryant, FNP  cetirizine (ZYRTEC) 5 MG tablet Take 5 mg by mouth daily.    [provider]  ergocalciferol (VITAMIN D2) 1.25 MG (50000 UT) capsule Take 50,000 Units by mouth once a week.    [provider]  fluticasone (FLONASE) 50 MCG/ACT nasal spray Place 1 spray into both nostrils daily for 3 days. 11/09/20 11/12/20  Gustavus Bryant, FNP  gabapentin  (NEURONTIN) 100 MG capsule Take 100 mg by mouth 3 (three) times daily.    [provider]  gabapentin (NEURONTIN) 300 MG capsule Take 300 mg by mouth as directed.    [provider]  gabapentin (NEURONTIN) 800 MG tablet Take 800 mg by mouth daily as needed (for pain).    [provider]  hydrochlorothiazide (HYDRODIURIL) 25 MG tablet Take 25 mg by mouth daily.    [provider]  meloxicam (MOBIC) 15 MG tablet Take 1 tablet by mouth daily. 11/06/17   [provider]  methocarbamol (ROBAXIN) 500 MG tablet Take 2 tablets (1,000 mg total) by mouth every 8 (eight) hours as needed for muscle spasms. 09/02/16   Arby Barrette, MD    Family History Family History  Problem Relation Age of Onset   Hypertension Mother     Social History Social History   Tobacco Use   Smoking status: Never    Passive exposure: Never   Smokeless tobacco: Never  Vaping Use   Vaping status: Never Used  Substance Use Topics   Alcohol use: No    Alcohol/week: 2.0 standard drinks of alcohol    Types: 2 Cans of beer per week    Comment: rarely   Drug use: No     Allergies   Edarbi [azilsartan], Hydrocortisone, and Hydrocodone  Review of Systems Review of Systems  Constitutional:  Positive for fever.  HENT:  Positive for congestion and rhinorrhea.   Respiratory:  Positive for cough.   Cardiovascular: Negative.   Gastrointestinal: Negative.   Musculoskeletal:  Positive for arthralgias and myalgias.  Psychiatric/Behavioral: Negative.       Physical Exam Triage Vital Signs ED Triage Vitals  Encounter Vitals Group     BP 03/24/23 0843 (!) 146/96     Systolic BP Percentile --      Diastolic BP Percentile --      Pulse Rate 03/24/23 0843 (!) 108     Resp 03/24/23 0843 18     Temp 03/24/23 0843 98.2 F (36.8 C)     Temp Source 03/24/23 0843 Oral     SpO2 03/24/23 0843 96 %     Weight 03/24/23 0841 190 lb (86.2 kg)     Height 03/24/23 0841 5\' 11"  (1.803  m)     Head Circumference --      Peak Flow --      Pain Score 03/24/23 0839 0     Pain Loc --      Pain Education --      Exclude from Growth Chart --    No data found.  Updated Vital Signs BP (!) 146/96 (BP Location: Left Arm) Comment: "I haven't had my medicine this morning"  Pulse (!) 108   Temp 98.2 F (36.8 C) (Oral)   Resp 18   Ht 5\' 11"  (1.803 m)   Wt 86.2 kg   SpO2 96%   BMI 26.50 kg/m   Visual Acuity Right Eye Distance:   Left Eye Distance:   Bilateral Distance:    Right Eye Near:   Left Eye Near:    Bilateral Near:     Physical Exam Constitutional:      General: He is not in acute distress.    Appearance: Normal appearance. He is normal weight. He is not ill-appearing or toxic-appearing.  HENT:     Nose: Congestion and rhinorrhea present.     Mouth/Throat:     Mouth: Mucous membranes are moist.  Cardiovascular:     Rate and Rhythm: Normal rate and regular rhythm.  Pulmonary:     Effort: Pulmonary effort is normal.     Breath sounds: Normal breath sounds.  Musculoskeletal:     Cervical back: Normal range of motion and neck supple. No tenderness.  Lymphadenopathy:     Cervical: No cervical adenopathy.  Skin:    General: Skin is warm.  Neurological:     General: No focal deficit present.     Mental Status: He is alert.  Psychiatric:        Mood and Affect: Mood normal.      UC Treatments / Results  Labs (all labs ordered are listed, but only abnormal results are displayed) Labs Reviewed  POC COVID19/FLU A&B COMBO    EKG   Radiology No results found.  Procedures Procedures (including critical care time)  Medications Ordered in UC Medications - No data to display  Initial Impression / Assessment and Plan / UC Course  I have reviewed the triage vital signs and the nursing notes.  Pertinent labs & imaging results that were available during my care of the patient were reviewed by me and considered in my medical decision making (see  chart for details).   Final Clinical Impressions(s) / UC Diagnoses   Final diagnoses:  Viral URI with cough  Discharge Instructions      You were seen today for upper respiratory symptoms.  Your flu and covid swab was negative today.  You likely have another virus causing symptoms.  I have sent out a medication to help with the cough.  Please get plenty of rest and fluids.   Return if not improving or worsening.     ED Prescriptions     Medication Sig Dispense Auth. Provider   benzonatate (TESSALON) 200 MG capsule Take 1 capsule (200 mg total) by mouth every 8 (eight) hours as needed for cough. 21 capsule Jannifer Franklin, MD      PDMP not reviewed this encounter.   Jannifer Franklin, MD 03/24/23 [phone]

## 2023-03-24 NOTE — ED Triage Notes (Signed)
"  This started Tuesday at work with sweats, chills, coughing, sneezing a lot, headaches, runny nose, lots of sweating at night for no reason, I know I have covid or flu". "The cough makes my chest sore but not hurt". No sob. No wheezing.

## 2023-05-23 DIAGNOSIS — M545 Low back pain, unspecified: Secondary | ICD-10-CM | POA: Insufficient documentation

## 2023-05-23 DIAGNOSIS — E559 Vitamin D deficiency, unspecified: Secondary | ICD-10-CM | POA: Insufficient documentation

## 2023-05-23 DIAGNOSIS — E785 Hyperlipidemia, unspecified: Secondary | ICD-10-CM | POA: Insufficient documentation

## 2023-06-15 ENCOUNTER — Encounter: Payer: Self-pay | Admitting: Nurse Practitioner

## 2023-08-23 ENCOUNTER — Encounter: Payer: Self-pay | Admitting: Emergency Medicine

## 2023-08-23 ENCOUNTER — Ambulatory Visit: Admission: EM | Admit: 2023-08-23 | Discharge: 2023-08-23 | Disposition: A | Discharge status: Home / Self Care

## 2023-08-23 DIAGNOSIS — J069 Acute upper respiratory infection, unspecified: Secondary | ICD-10-CM | POA: Diagnosis not present

## 2023-08-23 LAB — POC SOFIA SARS ANTIGEN FIA: SARS Coronavirus 2 Ag: NEGATIVE

## 2023-08-23 NOTE — ED Triage Notes (Signed)
 Pt reports sore throat, cough, fatigue, and nasal congestion that began yesterday. States his wife came home sick from [place]  last week. Dayquil and allegra used with no relief. Possible fever yesterday as he felt very warm.

## 2023-08-23 NOTE — ED Provider Notes (Signed)
 [facility]    CSN: [id number] Arrival date & time: 08/23/23  0801      History   Chief Complaint Chief Complaint  Patient presents with   Sore Throat   Cough   Nasal Congestion   Fever    HPI [name] is a 53 y.o. male.   Patient here today for evaluation of sore throat, cough, fatigue and nasal congestion that began yesterday.  He reports that his wife returned from [place]  sick last week.  He has taken DayQuil and Allegra without significant improvement.  He thinks he might of had a fever yesterday as he felt warm.  He does not report any vomiting or diarrhea.  The history is provided by the patient.    Past Medical History:  Diagnosis Date   Bronchitis    GERD (gastroesophageal reflux disease)    Hypertension     Patient Active Problem List   Diagnosis Date Noted   Hyperlipidemia 05/23/2023   Low back pain 05/23/2023   Vitamin D deficiency 05/23/2023   Essential hypertension 08/07/2022   Gastroesophageal reflux disease 08/07/2022   Degeneration of lumbar intervertebral disc 10/03/2017   Lumbar post-laminectomy syndrome 10/03/2017    Past Surgical History:  Procedure Laterality Date   BACK SURGERY     HERNIA REPAIR         Home Medications    Prior to Admission medications   Medication Sig Start Date End Date Taking? Authorizing Provider  ergocalciferol (VITAMIN D2) 1.25 MG (50000 UT) capsule Take 50,000 Units by mouth once a week.   Yes [provider]  hydrochlorothiazide (HYDRODIURIL) 12.5 MG tablet Take 1 tablet by mouth daily. 03/17/22  Yes [provider]  benzonatate  (TESSALON ) 200 MG capsule Take 1 capsule (200 mg total) by mouth every 8 (eight) hours as needed for cough. Patient not taking: Reported on 08/23/2023 03/24/23   Darral Longs, MD  cetirizine  (ZYRTEC ) 10 MG tablet Take 1 tablet (10 mg total) by mouth daily for 10 days. Patient not taking: Reported on 08/23/2023 11/09/20 11/19/20  Hazen Darryle BRAVO, FNP   cetirizine  (ZYRTEC ) 5 MG tablet Take 5 mg by mouth daily. Patient not taking: Reported on 08/23/2023    [provider]  fluticasone  (FLONASE ) 50 MCG/ACT nasal spray Place 1 spray into both nostrils daily for 3 days. Patient not taking: Reported on 08/23/2023 11/09/20 11/12/20  Hazen Darryle BRAVO, FNP  gabapentin (NEURONTIN) 100 MG capsule Take 100 mg by mouth 3 (three) times daily. Patient not taking: Reported on 08/23/2023    [provider]  gabapentin (NEURONTIN) 300 MG capsule Take 300 mg by mouth as directed.    [provider]  gabapentin (NEURONTIN) 800 MG tablet Take 800 mg by mouth daily as needed (for pain). Patient not taking: Reported on 08/23/2023    [provider]  hydrochlorothiazide (HYDRODIURIL) 25 MG tablet Take 25 mg by mouth daily. Patient not taking: Reported on 08/23/2023    [provider]  lansoprazole (PREVACID) 15 MG capsule Take by mouth daily. Patient not taking: Reported on 08/23/2023 05/23/23   [provider]  meloxicam (MOBIC) 15 MG tablet Take 1 tablet by mouth daily. Patient not taking: Reported on 08/23/2023 11/06/17   [provider]  methocarbamol  (ROBAXIN ) 500 MG tablet Take 2 tablets (1,000 mg total) by mouth every 8 (eight) hours as needed for muscle spasms. Patient not taking: Reported on 08/23/2023 09/02/16   Armenta Canning, MD  pantoprazole (PROTONIX) 20 MG tablet  [provider]    Family History Family History  Problem Relation Age of Onset   Hypertension Mother     Social History Social History   Tobacco Use   Smoking status: Never    Passive exposure: Never   Smokeless tobacco: Never  Vaping Use   Vaping status: Never Used  Substance Use Topics   Alcohol use: No    Alcohol/week: 2.0 standard drinks of alcohol    Types: 2 Cans of beer per week    Comment: rarely   Drug use: No     Allergies   Edarbi [azilsartan], Hydrocortisone, and Hydrocodone   Review of  Systems Review of Systems  Constitutional:  Positive for fever (subjective).  HENT:  Positive for congestion and sore throat. Negative for ear pain.   Eyes:  Negative for discharge and redness.  Respiratory:  Positive for cough. Negative for shortness of breath.   Gastrointestinal:  Negative for abdominal pain, diarrhea, nausea and vomiting.     Physical Exam Triage Vital Signs ED Triage Vitals  Encounter Vitals Group     BP      Girls Systolic BP Percentile      Girls Diastolic BP Percentile      Boys Systolic BP Percentile      Boys Diastolic BP Percentile      Pulse      Resp      Temp      Temp src      SpO2      Weight      Height      Head Circumference      Peak Flow      Pain Score      Pain Loc      Pain Education      Exclude from Growth Chart    No data found.  Updated Vital Signs BP 133/62 (BP Location: Left Arm)   Pulse 90   Temp 98.3 F (36.8 C) (Oral)   Resp 16   SpO2 97%   Visual Acuity Right Eye Distance:   Left Eye Distance:   Bilateral Distance:    Right Eye Near:   Left Eye Near:    Bilateral Near:     Physical Exam Vitals and nursing note reviewed.  Constitutional:      General: He is not in acute distress.    Appearance: Normal appearance. He is not ill-appearing.  HENT:     Head: Normocephalic and atraumatic.     Right Ear: There is impacted cerumen.     Nose: Congestion present.     Mouth/Throat:     Mouth: Mucous membranes are moist.     Pharynx: Oropharynx is clear. No oropharyngeal exudate or posterior oropharyngeal erythema.  Eyes:     Conjunctiva/sclera: Conjunctivae normal.  Cardiovascular:     Rate and Rhythm: Normal rate and regular rhythm.     Heart sounds: Normal heart sounds. No murmur heard. Pulmonary:     Effort: Pulmonary effort is normal. No respiratory distress.     Breath sounds: Normal breath sounds. No wheezing, rhonchi or rales.  Skin:    General: Skin is warm and dry.  Neurological:     Mental  Status: He is alert.  Psychiatric:        Mood and Affect: Mood normal.        Thought Content: Thought content normal.      UC Treatments / Results  Labs (all labs ordered are listed, but only  abnormal results are displayed) Labs Reviewed  POC SOFIA SARS ANTIGEN FIA    EKG   Radiology No results found.  Procedures Procedures (including critical care time)  Medications Ordered in UC Medications - No data to display  Initial Impression / Assessment and Plan / UC Course  I have reviewed the triage vital signs and the nursing notes.  Pertinent labs & imaging results that were available during my care of the patient were reviewed by me and considered in my medical decision making (see chart for details).     Suspect likely viral etiology of symptoms.  Rapid COVID screening negative but did advise to retake test tomorrow given short duration of symptoms at this time.  Patient expressed understanding.  Encouraged symptomatic treatment, increase water and rest and follow-up with any further concerns.   Final Clinical Impressions(s) / UC Diagnoses   Final diagnoses:  Acute upper respiratory infection   Discharge Instructions   None    ED Prescriptions   None    PDMP not reviewed this encounter.   Billy Asberry FALCON, PA-C 08/23/23 [phone]

## 2023-09-25 ENCOUNTER — Ambulatory Visit
Admission: EM | Admit: 2023-09-25 | Discharge: 2023-09-25 | Disposition: A | Discharge status: Home / Self Care | Attending: Physician Assistant | Admitting: Physician Assistant

## 2023-09-25 ENCOUNTER — Other Ambulatory Visit: Payer: Self-pay

## 2023-09-25 DIAGNOSIS — M7541 Impingement syndrome of right shoulder: Secondary | ICD-10-CM

## 2023-09-25 DIAGNOSIS — M25511 Pain in right shoulder: Secondary | ICD-10-CM | POA: Diagnosis not present

## 2023-09-25 MED ORDER — KETOROLAC TROMETHAMINE 30 MG/ML IJ SOLN
30.0000 mg | Freq: Once | INTRAMUSCULAR | Status: AC
  Administered 2023-09-25: 30 mg via INTRAMUSCULAR

## 2023-09-25 MED ORDER — PREDNISONE 20 MG PO TABS: 40.0000 mg | ORAL_TABLET | Freq: Every day | ORAL | 0 refills | Status: AC

## 2023-09-25 NOTE — ED Provider Notes (Signed)
 [facility]    CSN: [id number] Arrival date & time: 09/25/23  1009      History   Chief Complaint Chief Complaint  Patient presents with   Numbness   Tingling    R ARM     HPI Andre Walters is a 53 y.o. male.   HPI  Pt presents with concerns for right sided shoulder pain and tingling that starts in the right shoulder and radiates down to the hand. He also reports pain along the lateral aspect of the right pectoral muscle.  He states this has been ongoing for about 4 weeks and is not improving with home measures. 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 He hit the posterior aspect of the upper arm at the grocery store a few days ago and this seemed to aggravate his symptoms more. He does report some neck stiffness- states he has pain with turning head to the right which pulls along the shoulder blade.   He is right hand dominant. 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 but this has not provided improvement  Interventions: aleve, ibuprofen , heat and cool pads, tiger balm  Past Medical History:  Diagnosis Date   Bronchitis    GERD (gastroesophageal reflux disease)    Hypertension     Patient Active Problem List   Diagnosis Date Noted   Hyperlipidemia 05/23/2023   Low back pain 05/23/2023   Vitamin D deficiency 05/23/2023   Essential hypertension 08/07/2022   Gastroesophageal reflux disease 08/07/2022   Degeneration of lumbar intervertebral disc 10/03/2017   Lumbar post-laminectomy syndrome 10/03/2017    Past Surgical History:  Procedure Laterality Date   BACK SURGERY     HERNIA REPAIR         Home Medications    Prior to Admission medications   Medication Sig Start Date End Date Taking? Authorizing Provider  predniSONE  (DELTASONE ) 20 MG tablet Take 2 tablets (40 mg total) by mouth daily for 5 days. 09/25/23 09/30/23 Yes Yi Haugan E, PA-C  benzonatate  (TESSALON ) 200 MG  capsule Take 1 capsule (200 mg total) by mouth every 8 (eight) hours as needed for cough. Patient not taking: Reported on 08/23/2023 03/24/23   Darral Longs, MD  cetirizine  (ZYRTEC ) 10 MG tablet Take 1 tablet (10 mg total) by mouth daily for 10 days. Patient not taking: Reported on 08/23/2023 11/09/20 11/19/20  Hazen Darryle BRAVO, FNP  cetirizine  (ZYRTEC ) 5 MG tablet Take 5 mg by mouth daily. Patient not taking: Reported on 08/23/2023    [provider]  ergocalciferol (VITAMIN D2) 1.25 MG (50000 UT) capsule Take 50,000 Units by mouth once a week.    [provider]  fluticasone  (FLONASE ) 50 MCG/ACT nasal spray Place 1 spray into both nostrils daily for 3 days. Patient not taking: Reported on 08/23/2023 11/09/20 11/12/20  Hazen Darryle BRAVO, FNP  gabapentin (NEURONTIN) 100 MG capsule Take 100 mg by mouth 3 (three) times daily. Patient not taking: Reported on 08/23/2023    [provider]  gabapentin (NEURONTIN) 300 MG capsule Take 300 mg by mouth as directed.    [provider]  gabapentin (NEURONTIN) 800 MG tablet Take 800 mg by mouth daily as needed (for pain). Patient not taking: Reported on 08/23/2023    [provider]  hydrochlorothiazide (HYDRODIURIL) 12.5 MG tablet Take 1 tablet by mouth daily. 03/17/22   [provider]  hydrochlorothiazide (HYDRODIURIL) 25  MG tablet Take 25 mg by mouth daily. Patient not taking: Reported on 08/23/2023    [provider]  lansoprazole (PREVACID) 15 MG capsule Take by mouth daily. Patient not taking: Reported on 08/23/2023 05/23/23   [provider]  meloxicam (MOBIC) 15 MG tablet Take 1 tablet by mouth daily. Patient not taking: Reported on 08/23/2023 11/06/17   [provider]  methocarbamol  (ROBAXIN ) 500 MG tablet Take 2 tablets (1,000 mg total) by mouth every 8 (eight) hours as needed for muscle spasms. Patient not taking: Reported on 08/23/2023 09/02/16   Armenta Canning, MD  pantoprazole  (PROTONIX) 20 MG tablet     [provider]    Family History Family History  Problem Relation Age of Onset   Hypertension Mother     Social History Social History   Tobacco Use   Smoking status: Never    Passive exposure: Never   Smokeless tobacco: Never  Vaping Use   Vaping status: Never Used  Substance Use Topics   Alcohol use: No    Alcohol/week: 2.0 standard drinks of alcohol    Types: 2 Cans of beer per week    Comment: rarely   Drug use: No     Allergies   Edarbi [azilsartan], Hydrocortisone, and Hydrocodone   Review of Systems Review of Systems  Musculoskeletal:  Positive for arthralgias, myalgias and neck stiffness. Negative for neck pain.     Physical Exam Triage Vital Signs ED Triage Vitals  Encounter Vitals Group     BP 09/25/23 1015 (!) 140/96     Girls Systolic BP Percentile --      Girls Diastolic BP Percentile --      Boys Systolic BP Percentile --      Boys Diastolic BP Percentile --      Pulse Rate 09/25/23 1015 96     Resp 09/25/23 1015 18     Temp 09/25/23 1015 98.4 F (36.9 C)     Temp Source 09/25/23 1015 Oral     SpO2 09/25/23 1015 97 %     Weight 09/25/23 1015 188 lb (85.3 kg)     Height 09/25/23 1015 5' 11 (1.803 m)     Head Circumference --      Peak Flow --      Pain Score 09/25/23 1032 10     Pain Loc --      Pain Education --      Exclude from Growth Chart --    No data found.  Updated Vital Signs BP (!) 140/96 (BP Location: Left Arm)   Pulse 96   Temp 98.4 F (36.9 C) (Oral)   Resp 18   Ht 5' 11 (1.803 m)   Wt 188 lb (85.3 kg)   SpO2 97%   BMI 26.22 kg/m   Visual Acuity Right Eye Distance:   Left Eye Distance:   Bilateral Distance:    Right Eye Near:   Left Eye Near:    Bilateral Near:     Physical Exam Vitals reviewed.  Constitutional:      General: He is awake. He is not in acute distress.    Appearance: Normal appearance. He is well-developed and well-groomed. He is not ill-appearing or  toxic-appearing.  HENT:     Head: Normocephalic and atraumatic.  Eyes:     Extraocular Movements: Extraocular movements intact.     Conjunctiva/sclera: Conjunctivae normal.  Pulmonary:     Effort: Pulmonary effort is normal.  Musculoskeletal:  Right shoulder: Swelling and tenderness present. Normal range of motion. Normal strength.     Right upper arm: Normal.     Left upper arm: Normal.     Right hand: Normal strength.     Left hand: Normal strength.     Cervical back: Normal range of motion.     Comments: Range of motion of the shoulder is largely intact with regards to flexion, extension, internal rotation, external rotation, abduction and adduction.  He reports discomfort along the posterior upper arm with adduction. Pain with Hawkin's testing.    Neurological:     Mental Status: He is alert and oriented to person, place, and time.  Psychiatric:        Attention and Perception: Attention normal.        Mood and Affect: Mood normal.        Speech: Speech normal.        Behavior: Behavior normal. Behavior is cooperative.      UC Treatments / Results  Labs (all labs ordered are listed, but only abnormal results are displayed) Labs Reviewed - No data to display  EKG   Radiology No results found.  Procedures Procedures (including critical care time)  Medications Ordered in UC Medications  ketorolac  (TORADOL ) 30 MG/ML injection 30 mg (30 mg Intramuscular Given 09/25/23 1156)    Initial Impression / Assessment and Plan / UC Course  I have reviewed the triage vital signs and the nursing notes.  Pertinent labs & imaging results that were available during my care of the patient were reviewed by me and considered in my medical decision making (see chart for details).      Final Clinical Impressions(s) / UC Diagnoses   Final diagnoses:  Acute pain of right shoulder  Shoulder impingement syndrome, right   Patient presents today with concerns for right shoulder  pain with radiation into the arm and hand.  He reports some tingling sensation in the lower arm as well.  Range of motion is largely intact but patient does report some tenderness with certain movements.  He appears to have pain with Hawkins testing of the right arm.  At this time concern for potential impingement syndrome.  Given lack of improvement with OTC medications will try Toradol  30 mg injection here in clinic and provide patient with prednisone  40 mg p.o. daily x 5-day burst.  Reviewed importance of starting acid reducer to help prevent GI upset given NSAID and steroid use.  Rehab instructions were also provided in AVS to assist with discomfort.  Recommend follow-up with orthopedics if symptoms or not improving or seem to be worsening    Discharge Instructions      You were seen today for concerns of right-sided shoulder pain.  At this time I suspect that you have a soft tissue injury or something called shoulder impingement syndrome.  We have provided you with a Toradol  injection to help with your pain and I am sending you home with a prescription for prednisone  to be started tomorrow.  To help prevent upset stomach while taking the prednisone  I recommend starting an acid reducing medication such as famotidine .  You can take this for about 10 days which should help prevent irritation.  If your symptoms are not improving or seem to be worsening recommend follow-up with orthopedics     ED Prescriptions     Medication Sig Dispense Auth. Provider   predniSONE  (DELTASONE ) 20 MG tablet Take 2 tablets (40 mg total) by mouth daily for  5 days. 10 tablet Dotty Gonzalo E, PA-C      PDMP not reviewed this encounter.   Marylene Rocky BRAVO, PA-C 09/25/23 [id number]

## 2023-09-25 NOTE — Discharge Instructions (Addendum)
 You were seen today for concerns of right-sided shoulder pain.  At this time I suspect that you have a soft tissue injury or something called shoulder impingement syndrome.  We have provided you with a Toradol  injection to help with your pain and I am sending you home with a prescription for prednisone  to be started tomorrow.  To help prevent upset stomach while taking the prednisone  I recommend starting an acid reducing medication such as famotidine .  You can take this for about 10 days which should help prevent irritation.  If your symptoms are not improving or seem to be worsening recommend follow-up with orthopedics

## 2023-09-25 NOTE — ED Triage Notes (Signed)
 Pt presents with a chief complaints of numbness and tingling in right arm for approximately four weeks. States the pain begins in the right shoulder blade and radiates down the right arm. Currently rates overall pain a 10/10. Denies SOB or chest pain. Pt is not sure what has caused his symptoms. Believes he may have pulled a muscle when reaching for chips in grocery store although his symptoms began prior to this. Only made his pain worse. Mentions decreased sensation in right hand.

## 2023-11-27 ENCOUNTER — Encounter: Payer: Self-pay | Admitting: Radiology

## 2023-12-11 ENCOUNTER — Ambulatory Visit: Admitting: Orthopedic Surgery
